# Patient Record
Sex: Female | Born: 1995 | Race: White | Hispanic: No | Marital: Single | Smoking: Former smoker
Health system: Southern US, Community
[De-identification: ages and names within clinical notes are randomized; demographics above are authoritative.]

## PROBLEM LIST (undated history)

## (undated) DIAGNOSIS — K859 Acute pancreatitis without necrosis or infection, unspecified: Secondary | ICD-10-CM

## (undated) DIAGNOSIS — D649 Anemia, unspecified: Secondary | ICD-10-CM

## (undated) HISTORY — PX: WISDOM TOOTH EXTRACTION: SHX21

---
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
## 2014-02-20 ENCOUNTER — Ambulatory Visit (INDEPENDENT_AMBULATORY_CARE_PROVIDER_SITE_OTHER): Payer: 59 | Admitting: Family Medicine

## 2014-02-20 ENCOUNTER — Ambulatory Visit (INDEPENDENT_AMBULATORY_CARE_PROVIDER_SITE_OTHER): Payer: 59

## 2014-02-20 VITALS — BP 124/82 | HR 90 | Temp 98.1°F | Resp 16 | Ht 63.0 in | Wt 154.0 lb

## 2014-02-20 DIAGNOSIS — R059 Cough, unspecified: Secondary | ICD-10-CM

## 2014-02-20 DIAGNOSIS — R05 Cough: Secondary | ICD-10-CM

## 2014-02-20 MED ORDER — HYDROCODONE-HOMATROPINE 5-1.5 MG/5ML PO SYRP: 5.0000 mL | ORAL_SOLUTION | Freq: Three times a day (TID) | ORAL | Status: DC | PRN

## 2014-02-20 NOTE — Patient Instructions (Signed)
Use the cough syrup as needed for cough You likely have a viral infection that is nearing its end.  However please let me know if you are not feeling better in the next few days- Sooner if worse.

## 2014-02-20 NOTE — Progress Notes (Signed)
[facility] [address], [place] [id number] [phone]  Date:  02/20/2014   Name:  Kristi CornerJenna M Harmon   DOB:  [birth year]   MRN:  [id number]  PCP:  No primary care provider on file.    Chief Complaint: Cough; Fever; Emesis; and Chest Pain   History of Present Illness:  Kristi Harmon is a 18 y.o. very pleasant female patient who presents with the following:  She is here today with illness for about one week. She notes a productive cough.   She also has noted some emesis.  No diarrhea.  Last vomited 3 days ago She has had fevers all week- up to 102.   Sh has noted runny nose, sneezing, body aches and chills.   She has used ibuprofen OTC.   Her last dose was yesterday- she does not currently have a fever.   She is generally in good health but does tend to get frequent colds.  Never a smoker  There are no active problems to display for this patient.   History reviewed. No pertinent past medical history.  History reviewed. No pertinent past surgical history.  History  Substance Use Topics  . Smoking status: Never Smoker   . Smokeless tobacco: Not on file  . Alcohol Use: No    Family History  Problem Relation Age of Onset  . Alcohol abuse Mother   . Cancer Paternal Grandmother     No Known Allergies  Medication list has been reviewed and updated.  No current outpatient prescriptions on file prior to visit.   No current facility-administered medications on file prior to visit.    Review of Systems:  As per HPI- otherwise negative.   Physical Examination: Filed Vitals:   02/20/14 1308  BP: 124/82  Pulse: 106  Temp: 98.1 F (36.7 C)  Resp: 16   Filed Vitals:   02/20/14 1308  Height: 5\' 3"  (1.6 m)  Weight: 154 lb (69.854 kg)   Body mass index is 27.29 kg/(m^2). Ideal Body Weight: Weight in (lb) to have BMI = 25: 140.8  GEN: WDWN, NAD, Non-toxic, A & O x 3, mild overweight, looks well HEENT: Atraumatic, Normocephalic. Neck supple.  No masses, No LAD.  Bilateral TM wnl, oropharynx normal.  PEERL,EOMI.   Ears and Nose: No external deformity. CV: RRR, No M/G/R. No JVD. No thrill. No extra heart sounds. PULM: CTA B, no wheezes, crackles, rhonchi. No retractions. No resp. distress. No accessory muscle use. ABD: S, NT, ND. No rebound. No HSM. Tenderness over her MSK chest due to prolonged cough EXTR: No c/c/e NEURO Normal gait.  PSYCH: Normally interactive. Conversant. Not depressed or anxious appearing.  Calm demeanor.   [facility] reading (PRIMARY) by  Dr. Patsy Lageropland. CXR: negative  CHEST 2 VIEW  COMPARISON: Chest x-ray 06/02/2012.  FINDINGS: Mediastinum and hilar structures normal. Lungs are clear. Heart size normal. No pleural effusion or pneumothorax. No acute osseous abnormality.  IMPRESSION: No active cardiopulmonary disease Assessment and Plan: Cough - Plan: DG Chest 2 View, HYDROcodone-homatropine (HYCODAN) 5-1.5 MG/5ML syrup  Treat for likely viral infection with hycodan as needed, continue ibuprofen as needed  See patient instructions for more details.     Signed Abbe AmsterdamJessica Tawonda Legaspi, MD

## 2016-02-24 ENCOUNTER — Emergency Department (HOSPITAL_COMMUNITY)
Admission: EM | Admit: 2016-02-24 | Discharge: 2016-02-25 | Disposition: A | Discharge status: Home / Self Care | Payer: Self-pay | Attending: Emergency Medicine | Admitting: Emergency Medicine

## 2016-02-24 ENCOUNTER — Encounter (HOSPITAL_COMMUNITY): Payer: Self-pay

## 2016-02-24 ENCOUNTER — Emergency Department (HOSPITAL_COMMUNITY): Payer: Self-pay

## 2016-02-24 DIAGNOSIS — F17210 Nicotine dependence, cigarettes, uncomplicated: Secondary | ICD-10-CM | POA: Insufficient documentation

## 2016-02-24 DIAGNOSIS — K859 Acute pancreatitis without necrosis or infection, unspecified: Secondary | ICD-10-CM

## 2016-02-24 HISTORY — DX: Anemia, unspecified: D64.9

## 2016-02-24 LAB — I-STAT BETA HCG BLOOD, ED (MC, WL, AP ONLY)

## 2016-02-24 LAB — CBC
HEMATOCRIT: 44.5 % (ref 36.0–46.0)
Hemoglobin: 14.6 g/dL (ref 12.0–15.0)
MCH: 29.7 pg (ref 26.0–34.0)
MCHC: 32.8 g/dL (ref 30.0–36.0)
MCV: 90.6 fL (ref 78.0–100.0)
PLATELETS: 339 10*3/uL (ref 150–400)
RBC: 4.91 MIL/uL (ref 3.87–5.11)
RDW: 14.8 % (ref 11.5–15.5)
WBC: 11.6 10*3/uL — AB (ref 4.0–10.5)

## 2016-02-24 LAB — COMPREHENSIVE METABOLIC PANEL
ALT: 13 U/L — AB (ref 14–54)
AST: 20 U/L (ref 15–41)
Albumin: 4.3 g/dL (ref 3.5–5.0)
Alkaline Phosphatase: 66 U/L (ref 38–126)
Anion gap: 9 (ref 5–15)
BUN: 9 mg/dL (ref 6–20)
CHLORIDE: 102 mmol/L (ref 101–111)
CO2: 27 mmol/L (ref 22–32)
CREATININE: 0.81 mg/dL (ref 0.44–1.00)
Calcium: 9.8 mg/dL (ref 8.9–10.3)
GFR calc Af Amer: >60 mL/min (ref >60)
Glucose, Bld: 103 mg/dL — ABNORMAL HIGH (ref 65–99)
POTASSIUM: 4.1 mmol/L (ref 3.5–5.1)
SODIUM: 138 mmol/L (ref 135–145)
Total Bilirubin: 0.5 mg/dL (ref 0.3–1.2)
Total Protein: 7.6 g/dL (ref 6.5–8.1)

## 2016-02-24 LAB — URINALYSIS, ROUTINE W REFLEX MICROSCOPIC
BILIRUBIN URINE: NEGATIVE
Glucose, UA: NEGATIVE mg/dL
Hgb urine dipstick: NEGATIVE
KETONES UR: NEGATIVE mg/dL
Nitrite: NEGATIVE
PROTEIN: 30 mg/dL — AB
Specific Gravity, Urine: 1.015 (ref 1.005–1.030)
pH: 6 (ref 5.0–8.0)

## 2016-02-24 LAB — LIPASE, BLOOD: LIPASE: 98 U/L — AB (ref 11–51)

## 2016-02-24 MED ORDER — OXYCODONE-ACETAMINOPHEN 5-325 MG PO TABS: 2.0000 | ORAL_TABLET | ORAL | 0 refills | Status: DC | PRN

## 2016-02-24 MED ORDER — METOCLOPRAMIDE HCL 5 MG/ML IJ SOLN
10.0000 mg | Freq: Once | INTRAMUSCULAR | Status: AC
  Administered 2016-02-24: 10 mg via INTRAVENOUS
  Filled 2016-02-24: qty 2

## 2016-02-24 MED ORDER — ONDANSETRON 8 MG PO TBDP: 8.0000 mg | ORAL_TABLET | Freq: Three times a day (TID) | ORAL | 0 refills | Status: DC | PRN

## 2016-02-24 MED ORDER — SODIUM CHLORIDE 0.9 % IV BOLUS (SEPSIS)
1000.0000 mL | Freq: Once | INTRAVENOUS | Status: AC
  Administered 2016-02-24: 1000 mL via INTRAVENOUS

## 2016-02-24 MED ORDER — MORPHINE SULFATE (PF) 4 MG/ML IV SOLN
2.0000 mg | Freq: Once | INTRAVENOUS | Status: AC
  Administered 2016-02-24: 2 mg via INTRAVENOUS
  Filled 2016-02-24: qty 1

## 2016-02-24 NOTE — ED Triage Notes (Signed)
Onset 3 days epigastric pain that is moving to umbilicus and lower abd.  C/o nausea.  No vomiting, diarrhea, fever.  No one in household sick.  Pt took pepto, midol with no relief.

## 2016-02-24 NOTE — Discharge Instructions (Addendum)
To find a primary care or specialty doctor please call [phone] or [phone] to access "[facility] Find a Doctor Service." ° °You may also go on the [facility] website at [email or web address] ° °There are also multiple [facility], [facility] and [facility] practices throughout the [place] that are frequently accepting new patients. You may find a clinic that is close to your home and contact them. ° °[facility] -  °[address] °[place] [address] [phone] ° ° °[facility] -  °[address] °[place] [address] [phone] ° ° °[facility] - °[address]  °[place] [address] °[phone] ° ° °

## 2016-02-24 NOTE — ED Provider Notes (Signed)
[facility]-EMERGENCY DEPT Provider Note   CSN: [id number] Arrival date & time: 02/24/16  2045     History   Chief Complaint Chief Complaint  Patient presents with  . Abdominal Pain    HPI Kristi Harmon is a 20 y.o. female.  HPI  Previously healthy 20 year old female G0 P0 6 active using condoms for birth control LMP last week normal. Normal time persisting complaining of upper abdominal pain that began 3 days ago. That she thought she was having some cramps initially. It has been intermittent and cramping. It seems to worsen somewhat with food. She's had nausea with one episode of vomiting this morning. She has not had fever or diarrhea. She blood or bile in vomit. She denies any abnormal vaginal discharge. She had some similar symptoms one time in the distant past and was seen a primary care office chest x-Joseline Mccampbell done. She denies any cough. She does smoke and drink alcohol after the symptoms began. His taken Pepto-Bismol, Midol, and ibuprofen without relief.  Past Medical History:  Diagnosis Date  . Anemia     There are no active problems to display for this patient.   Past Surgical History:  Procedure Laterality Date  . WISDOM TOOTH EXTRACTION      OB History    No data available       Home Medications    Prior to Admission medications   Medication Sig Start Date End Date Taking? Authorizing Provider  HYDROcodone-homatropine (HYCODAN) 5-1.5 MG/5ML syrup Take 5 mLs by mouth every 8 (eight) hours as needed for cough. 02/20/14   Pearline CablesJessica C Copland, MD    Family History Family History  Problem Relation Age of Onset  . Alcohol abuse Mother   . Cancer Paternal Grandmother     Social History Social History  Substance Use Topics  . Smoking status: Current Every Day Smoker    Types: Cigarettes  . Smokeless tobacco: Never Used  . Alcohol use Yes     Allergies   Patient has no known allergies.   Review of Systems Review of Systems  All other systems reviewed and are  negative.    Physical Exam Updated Vital Signs LMP 02/15/2016   Physical Exam  Constitutional: She is oriented to person, place, and time. She appears well-developed and well-nourished. No distress.  HENT:  Head: Normocephalic and atraumatic.  Right Ear: External ear normal.  Left Ear: External ear normal.  Nose: Nose normal.  Eyes: Conjunctivae and EOM are normal. Pupils are equal, round, and reactive to light.  Neck: Normal range of motion. Neck supple.  Cardiovascular: Normal rate, regular rhythm and normal heart sounds.   Pulmonary/Chest: Effort normal and breath sounds normal.  Abdominal: Soft. Bowel sounds are normal. She exhibits no distension and no mass. There is tenderness. There is no guarding.  Tenderness palpation epigastrium and left upper quadrant  Genitourinary: Vagina normal and uterus normal. No vaginal discharge found.  Musculoskeletal: Normal range of motion.  Neurological: She is alert and oriented to person, place, and time. She exhibits normal muscle tone. Coordination normal.  Skin: Skin is warm and dry.  Psychiatric: She has a normal mood and affect. Her behavior is normal. Thought content normal.  Nursing note and vitals reviewed.    ED Treatments / Results  Labs (all labs ordered are listed, but only abnormal results are displayed) Labs Reviewed  LIPASE, BLOOD - Abnormal; Notable for the following:       Result Value   Lipase 98 (*)  All other components within normal limits  COMPREHENSIVE METABOLIC PANEL - Abnormal; Notable for the following:    Glucose, Bld 103 (*)    ALT 13 (*)    All other components within normal limits  CBC - Abnormal; Notable for the following:    WBC 11.6 (*)    All other components within normal limits  URINALYSIS, ROUTINE W REFLEX MICROSCOPIC - Abnormal; Notable for the following:    APPearance HAZY (*)    Protein, ur 30 (*)    Leukocytes, UA TRACE (*)    Bacteria, UA RARE (*)    Squamous Epithelial / LPF 0-5  (*)    All other components within normal limits  I-STAT BETA HCG BLOOD, ED ([facility], [facility], [facility] ONLY)    EKG  EKG Interpretation None       Radiology No results found.  Procedures Procedures (including critical care time)  Medications Ordered in ED Medications - No data to display   Initial Impression / Assessment and Plan / ED Course  I have reviewed the triage vital signs and the nursing notes.  Pertinent labs & imaging results that were available during my care of the patient were reviewed by me and considered in my medical decision making (see chart for details).  Clinical Course   Renal-year-old female presented today with epigastric pain, nausea, and vomiting. Lipase is elevated here at 98 consistent with pancreatitis. Ultrasound pending. She is getting IV fluids. Medicines and antibiotics. Plan is to treat her pain, nausea, and she'll be referred outpatient to GI for follow-up.   [place] reviewed.  No gs. Discussed op and f/u care with patient and voices understanding.  Final Clinical Impressions(s) / ED Diagnoses   Final diagnoses:  Acute pancreatitis, unspecified complication status, unspecified pancreatitis type    New Prescriptions New Prescriptions   No medications on file     Margarita Grizzleanielle Laural Eiland, MD 02/25/16 0104

## 2016-02-25 MED ORDER — PROMETHAZINE HCL 25 MG PO TABS: 25.0000 mg | ORAL_TABLET | Freq: Four times a day (QID) | ORAL | 0 refills | Status: DC | PRN

## 2016-02-25 MED ORDER — MORPHINE SULFATE (PF) 4 MG/ML IV SOLN
4.0000 mg | Freq: Once | INTRAVENOUS | Status: AC
  Administered 2016-02-25: 4 mg via INTRAVENOUS
  Filled 2016-02-25: qty 1

## 2016-02-25 NOTE — ED Provider Notes (Signed)
1:10 AM  Assumed care from Dr. Rosalia Hammersay.  Pt is a 20 y.o. F who presents emergency department with epigastric pain for 3 days. Has had nausea and vomiting. Lipase mildly elevated. Ultrasound to rule out gallstones was pending. Ultrasound has been unremarkable. Patient reports feeling better after morphine but distal having some pain. We'll repeat a dose of pain medication and discharge home with outpatient follow-up. Prescription for Percocet, Phenergan provided. Discussed return precautions. She is comfortable with this plan.   Kristi MawKristen N Raylene Carmickle, DO 02/25/16 [phone]

## 2016-06-16 ENCOUNTER — Emergency Department (HOSPITAL_COMMUNITY)
Admission: EM | Admit: 2016-06-16 | Discharge: 2016-06-16 | Disposition: A | Discharge status: Home / Self Care | Payer: Self-pay | Attending: Emergency Medicine | Admitting: Emergency Medicine

## 2016-06-16 ENCOUNTER — Encounter (HOSPITAL_COMMUNITY): Payer: Self-pay | Admitting: Emergency Medicine

## 2016-06-16 DIAGNOSIS — F17210 Nicotine dependence, cigarettes, uncomplicated: Secondary | ICD-10-CM | POA: Insufficient documentation

## 2016-06-16 DIAGNOSIS — B86 Scabies: Secondary | ICD-10-CM | POA: Insufficient documentation

## 2016-06-16 MED ORDER — PERMETHRIN 5 % EX CREA: TOPICAL_CREAM | CUTANEOUS | 2 refills | Status: DC

## 2016-06-16 MED ORDER — HYDROXYZINE HCL 10 MG PO TABS: 10.0000 mg | ORAL_TABLET | Freq: Four times a day (QID) | ORAL | 0 refills | Status: DC | PRN

## 2016-06-16 NOTE — ED Triage Notes (Signed)
Pt complaint of worsening generalized hives of unknown cause; denies new soaps, detergents, foods, or medications.

## 2016-06-16 NOTE — ED Provider Notes (Signed)
[facility]-EMERGENCY DEPT Provider Note   CSN: [id number] Arrival date & time: 06/16/16  1726  By signing my name below, I, Sonum Patel, attest that this documentation has been prepared under the direction and in the presence of Will Bridie Colquhoun, PA-C. Electronically Signed: Sonum Patel, Neurosurgeon. 06/16/16. 6:09 PM.  History   Chief Complaint Chief Complaint  Patient presents with  . Rash    The history is provided by the patient. No language interpreter was used.    HPI Comments: ESLY Harmon is a 21 y.o. female who presents to the Emergency Department complaining of a persistent, gradually worsened, pruritic rash to the entire body that has been ongoing for the past couple of weeks. She states the rash is also present in the web spacing of her hands. She states the itching is worse after showering and at night. She states her roommate has the same symptoms. She denies fever, SOB, or any other symptoms at this time.   Past Medical History:  Diagnosis Date  . Anemia     There are no active problems to display for this patient.   Past Surgical History:  Procedure Laterality Date  . WISDOM TOOTH EXTRACTION      OB History    No data available       Home Medications    Prior to Admission medications   Medication Sig Start Date End Date Taking? Authorizing Provider  HYDROcodone-homatropine (HYCODAN) 5-1.5 MG/5ML syrup Take 5 mLs by mouth every 8 (eight) hours as needed for cough. 02/20/14   Pearline Cables, MD  hydrOXYzine (ATARAX/VISTARIL) 10 MG tablet Take 1 tablet (10 mg total) by mouth every 6 (six) hours as needed for itching. 06/16/16   Everlene Farrier, PA-C  ondansetron (ZOFRAN ODT) 8 MG disintegrating tablet Take 1 tablet (8 mg total) by mouth every 8 (eight) hours as needed for nausea or vomiting. 02/24/16   Margarita Grizzle, MD  oxyCODONE-acetaminophen (PERCOCET/ROXICET) 5-325 MG tablet Take 2 tablets by mouth every 4 (four) hours as needed for severe pain. 02/24/16   Margarita Grizzle,  MD  permethrin (ELIMITE) 5 % cream Apply to entire body other than face - let sit for 14 hours then wash off, may repeat in 1 week if still having symptoms 06/16/16   Everlene Farrier, PA-C  promethazine (PHENERGAN) 25 MG tablet Take 1 tablet (25 mg total) by mouth every 6 (six) hours as needed for nausea or vomiting. 02/25/16   Layla Maw Ward, DO    Family History Family History  Problem Relation Age of Onset  . Alcohol abuse Mother   . Cancer Paternal Grandmother     Social History Social History  Substance Use Topics  . Smoking status: Current Every Day Smoker    Types: Cigarettes  . Smokeless tobacco: Never Used  . Alcohol use Yes     Allergies   Patient has no known allergies.   Review of Systems Review of Systems  Constitutional: Negative for fever.  HENT: Negative for facial swelling.   Respiratory: Negative for shortness of breath.   Skin: Positive for rash.     Physical Exam Updated Vital Signs BP 109/75 (BP Location: Left Arm)   Pulse 88   Temp 98.1 F (36.7 C) (Oral)   Resp 18   Wt 48.8 kg   SpO2 99%   BMI 19.04 kg/m   Physical Exam  Constitutional: She appears well-developed and well-nourished. No distress.  Nontoxic-appearing.  HENT:  Head: Normocephalic and atraumatic.  Mouth/Throat: Oropharynx is  clear and moist.  Eyes: Right eye exhibits no discharge. Left eye exhibits no discharge.  Pulmonary/Chest: Effort normal. No respiratory distress.  Neurological: She is alert. Coordination normal.  Skin: Skin is warm and dry. Capillary refill takes less than 2 seconds. Rash noted. She is not diaphoretic. No erythema. No pallor.  Excoriations and burrows noted to her bilateral arms, legs, chest, and abdomen. Burrows and lesions noted between her web spaces. No erythema, vesicles, or discharge. No hives.   Psychiatric: She has a normal mood and affect. Her behavior is normal.  Nursing note and vitals reviewed.    ED Treatments / Results  DIAGNOSTIC  STUDIES: Oxygen Saturation is 99% on RA, normal by my interpretation.    COORDINATION OF CARE: 6:04 PM Discussed treatment plan with pt at bedside and pt agreed to plan.   Labs (all labs ordered are listed, but only abnormal results are displayed) Labs Reviewed - No data to display  EKG  EKG Interpretation None       Radiology No results found.  Procedures Procedures (including critical care time)  Medications Ordered in ED Medications - No data to display   Initial Impression / Assessment and Plan / ED Course  I have reviewed the triage vital signs and the nursing notes.  Pertinent labs & imaging results that were available during my care of the patient were reviewed by me and considered in my medical decision making (see chart for details).    Patient with itching for several weeks throughout her body including her web spaces. Roommate at home also has the same symptoms. Patient has classic symptoms of scabies. Evidence of burrows between her web spaces. The rash spares her face and head. Discussed diagnosis & treatment of scabies with patient.  They have been advised to followup with her primary care doctor 2 weeks after treatment.  They have also been advised to clean entire household including washing sheets in the hottest water possible and using R.I.D. spray in the car and on sofa. Discussed the proper application of permethrin cream.  Patient  advised to repeat treatment in one week. No signs of secondary infection.  Discussed return precautions. Patient advised to follow up with PCP for unresolved symptoms. Patient appears safe for discharge. I advised the patient to follow-up with their primary care provider this week. I advised the patient to return to the emergency department with new or worsening symptoms or new concerns. The patient verbalized understanding and agreement with plan.     Final Clinical Impressions(s) / ED Diagnoses   Final diagnoses:  Scabies     New Prescriptions New Prescriptions   HYDROXYZINE (ATARAX/VISTARIL) 10 MG TABLET    Take 1 tablet (10 mg total) by mouth every 6 (six) hours as needed for itching.   PERMETHRIN (ELIMITE) 5 % CREAM    Apply to entire body other than face - let sit for 14 hours then wash off, may repeat in 1 week if still having symptoms   I personally performed the services described in this documentation, which was scribed in my presence. The recorded information has been reviewed and is accurate.       Everlene Farrier, PA-C 06/16/16 1815    Shaune Pollack, MD 06/18/16 [phone]

## 2016-08-03 ENCOUNTER — Encounter (HOSPITAL_COMMUNITY): Payer: Self-pay | Admitting: Emergency Medicine

## 2016-08-03 ENCOUNTER — Emergency Department (HOSPITAL_COMMUNITY)
Admission: EM | Admit: 2016-08-03 | Discharge: 2016-08-03 | Disposition: A | Discharge status: Home / Self Care | Payer: Self-pay | Attending: Emergency Medicine | Admitting: Emergency Medicine

## 2016-08-03 DIAGNOSIS — S80262A Insect bite (nonvenomous), left knee, initial encounter: Secondary | ICD-10-CM | POA: Insufficient documentation

## 2016-08-03 DIAGNOSIS — Y939 Activity, unspecified: Secondary | ICD-10-CM | POA: Insufficient documentation

## 2016-08-03 DIAGNOSIS — W57XXXA Bitten or stung by nonvenomous insect and other nonvenomous arthropods, initial encounter: Secondary | ICD-10-CM | POA: Insufficient documentation

## 2016-08-03 DIAGNOSIS — Y999 Unspecified external cause status: Secondary | ICD-10-CM | POA: Insufficient documentation

## 2016-08-03 DIAGNOSIS — F17210 Nicotine dependence, cigarettes, uncomplicated: Secondary | ICD-10-CM | POA: Insufficient documentation

## 2016-08-03 DIAGNOSIS — Y929 Unspecified place or not applicable: Secondary | ICD-10-CM | POA: Insufficient documentation

## 2016-08-03 LAB — CBC WITH DIFFERENTIAL/PLATELET
BASOS ABS: 0 10*3/uL (ref 0.0–0.1)
BASOS PCT: 0 %
EOS ABS: 0.1 10*3/uL (ref 0.0–0.7)
EOS PCT: 1 %
HCT: 41.4 % (ref 36.0–46.0)
Hemoglobin: 13.7 g/dL (ref 12.0–15.0)
Lymphocytes Relative: 38 %
Lymphs Abs: 2.9 10*3/uL (ref 0.7–4.0)
MCH: 29.5 pg (ref 26.0–34.0)
MCHC: 33.1 g/dL (ref 30.0–36.0)
MCV: 89 fL (ref 78.0–100.0)
MONO ABS: 0.5 10*3/uL (ref 0.1–1.0)
Monocytes Relative: 7 %
Neutro Abs: 4.1 10*3/uL (ref 1.7–7.7)
Neutrophils Relative %: 54 %
PLATELETS: 291 10*3/uL (ref 150–400)
RBC: 4.65 MIL/uL (ref 3.87–5.11)
RDW: 14.4 % (ref 11.5–15.5)
WBC: 7.7 10*3/uL (ref 4.0–10.5)

## 2016-08-03 LAB — URINALYSIS, ROUTINE W REFLEX MICROSCOPIC
BILIRUBIN URINE: NEGATIVE
GLUCOSE, UA: NEGATIVE mg/dL
Hgb urine dipstick: NEGATIVE
Ketones, ur: NEGATIVE mg/dL
NITRITE: NEGATIVE
PH: 6 (ref 5.0–8.0)
Protein, ur: NEGATIVE mg/dL
SPECIFIC GRAVITY, URINE: 1.019 (ref 1.005–1.030)

## 2016-08-03 LAB — COMPREHENSIVE METABOLIC PANEL
ALBUMIN: 3.9 g/dL (ref 3.5–5.0)
ALK PHOS: 42 U/L (ref 38–126)
ALT: 13 U/L — ABNORMAL LOW (ref 14–54)
AST: 15 U/L (ref 15–41)
Anion gap: 5 (ref 5–15)
BILIRUBIN TOTAL: 0.4 mg/dL (ref 0.3–1.2)
BUN: 13 mg/dL (ref 6–20)
CALCIUM: 9 mg/dL (ref 8.9–10.3)
CO2: 24 mmol/L (ref 22–32)
Chloride: 108 mmol/L (ref 101–111)
Creatinine, Ser: 0.75 mg/dL (ref 0.44–1.00)
GFR calc Af Amer: >60 mL/min (ref >60)
GLUCOSE: 95 mg/dL (ref 65–99)
POTASSIUM: 4.1 mmol/L (ref 3.5–5.1)
Sodium: 137 mmol/L (ref 135–145)
TOTAL PROTEIN: 7.5 g/dL (ref 6.5–8.1)

## 2016-08-03 LAB — I-STAT BETA HCG BLOOD, ED (MC, WL, AP ONLY): I-stat hCG, quantitative: <5 m[IU]/mL (ref <5)

## 2016-08-03 MED ORDER — DOXYCYCLINE HYCLATE 100 MG PO CAPS: 100.0000 mg | ORAL_CAPSULE | Freq: Two times a day (BID) | ORAL | 0 refills | Status: DC

## 2016-08-03 NOTE — Discharge Instructions (Signed)
Use hydrocortisone cream for itching Alternate heat and ice Take Doxycycline for the next 5 days Return if worsening

## 2016-08-03 NOTE — ED Triage Notes (Signed)
Patient here with complaints of insect bite to left leg near knee x1 week. Pain 4/10 increased with walking. Denies fever.

## 2016-08-03 NOTE — ED Provider Notes (Signed)
[facility]-EMERGENCY DEPT Provider Note    By signing my name below, I, Earmon Phoenix, attest that this documentation has been prepared under the direction and in the presence of Terance Hart, PA-C. Electronically Signed: Earmon Phoenix, ED Scribe. 08/03/16. 12:22 PM.    History   Chief Complaint Chief Complaint  Patient presents with  . Insect Bite    The history is provided by the patient and medical records. No language interpreter was used.    Kristi Harmon is a 21 y.o. female who presents to the Emergency Department complaining of an itching, progressively worsening insect bite to the upper lateral left knee that occurred four days ago. She reports associated nausea, HA, diaphoresis, myalgias, left knee pain. She did not feel a bite or see what bit her and assumes it was an insect. She denies anyone in her household has anything similar. She states she had cold-like symptoms last week that have resolved and she believes are unrelated. She has not taking anything for her symptoms. There are no modifying factors noted. She denies difficulty walking, difficulty flexing and extending the knee, vomiting, fever, chills, rash. Her main concern is that it has been getting bigger.   Past Medical History:  Diagnosis Date  . Anemia     There are no active problems to display for this patient.   Past Surgical History:  Procedure Laterality Date  . WISDOM TOOTH EXTRACTION      OB History    No data available       Home Medications    Prior to Admission medications   Medication Sig Start Date End Date Taking? Authorizing Provider  HYDROcodone-homatropine (HYCODAN) 5-1.5 MG/5ML syrup Take 5 mLs by mouth every 8 (eight) hours as needed for cough. 02/20/14   Copland, Gwenlyn Found, MD  hydrOXYzine (ATARAX/VISTARIL) 10 MG tablet Take 1 tablet (10 mg total) by mouth every 6 (six) hours as needed for itching. 06/16/16   Everlene Farrier, PA-C  ondansetron (ZOFRAN ODT) 8 MG disintegrating tablet  Take 1 tablet (8 mg total) by mouth every 8 (eight) hours as needed for nausea or vomiting. 02/24/16   Margarita Grizzle, MD  oxyCODONE-acetaminophen (PERCOCET/ROXICET) 5-325 MG tablet Take 2 tablets by mouth every 4 (four) hours as needed for severe pain. 02/24/16   Margarita Grizzle, MD  permethrin (ELIMITE) 5 % cream Apply to entire body other than face - let sit for 14 hours then wash off, may repeat in 1 week if still having symptoms 06/16/16   Everlene Farrier, PA-C  promethazine (PHENERGAN) 25 MG tablet Take 1 tablet (25 mg total) by mouth every 6 (six) hours as needed for nausea or vomiting. 02/25/16   Ward, Layla Maw, DO    Family History Family History  Problem Relation Age of Onset  . Alcohol abuse Mother   . Cancer Paternal Grandmother     Social History Social History  Substance Use Topics  . Smoking status: Current Every Day Smoker    Types: Cigarettes  . Smokeless tobacco: Never Used  . Alcohol use Yes     Allergies   Patient has no known allergies.   Review of Systems Review of Systems  Constitutional: Positive for diaphoresis. Negative for chills and fever.  Gastrointestinal: Positive for nausea. Negative for vomiting.  Musculoskeletal: Positive for arthralgias. Negative for joint swelling.  Skin: Positive for color change and wound. Negative for rash.  Neurological: Positive for headaches.     Physical Exam Updated Vital Signs BP 117/77 (BP Location: Right  Arm)   Pulse 79   Temp 97.8 F (36.6 C) (Oral)   Resp 18   SpO2 99%   Physical Exam  Constitutional: She is oriented to person, place, and time. She appears well-developed and well-nourished. No distress.  HENT:  Head: Normocephalic and atraumatic.  Eyes: Conjunctivae are normal. Pupils are equal, round, and reactive to light. Right eye exhibits no discharge. Left eye exhibits no discharge. No scleral icterus.  Neck: Normal range of motion.  Cardiovascular: Normal rate.   Pulmonary/Chest: Effort normal.  No respiratory distress.  Abdominal: She exhibits no distension.  Musculoskeletal: Normal range of motion.  FROM of knee. Ambulatory  Neurological: She is alert and oriented to person, place, and time.  Skin: Skin is warm and dry.  2cm circular erythema which feels indurated under the skin. No central area of necrosis. No other skin lesions noted. See picture for details.  Psychiatric: She has a normal mood and affect. Her behavior is normal.  Nursing note and vitals reviewed.      ED Treatments / Results  DIAGNOSTIC STUDIES: Oxygen Saturation is 99% on RA, normal by my interpretation.   COORDINATION OF CARE: 10:29 AM- Will speak with Dr. Clarene Duke about appropriate plan of care. Pt verbalizes understanding and agrees to plan.  11:24 AM- Will order labs.   Medications - No data to display  Labs (all labs ordered are listed, but only abnormal results are displayed) Labs Reviewed  COMPREHENSIVE METABOLIC PANEL - Abnormal; Notable for the following:       Result Value   ALT 13 (*)    All other components within normal limits  URINALYSIS, ROUTINE W REFLEX MICROSCOPIC - Abnormal; Notable for the following:    APPearance HAZY (*)    Leukocytes, UA TRACE (*)    Bacteria, UA RARE (*)    Squamous Epithelial / LPF 6-30 (*)    All other components within normal limits  CBC WITH DIFFERENTIAL/PLATELET  I-STAT BETA HCG BLOOD, ED ([facility], [facility], [facility] ONLY)    EKG  EKG Interpretation None       Radiology No results found.  Procedures Procedures (including critical care time)  Medications Ordered in ED Medications - No data to display   Initial Impression / Assessment and Plan / ED Course  I have reviewed the triage vital signs and the nursing notes.  Pertinent labs & imaging results that were available during my care of the patient were reviewed by me and considered in my medical decision making (see chart for details).  22 year old female with insect bite of unknown origin.  Vitals are normal. Bite is minimal however pt is reporting a multitude of symptoms. Shared visit with Dr. Clarene Duke. Will obtain labs and UA. If these are normal will treat with Doxy empirically.  Labs are unremarkable. UA normal. Preg test negative. Will treat with Doxy and advised supportive care with ice/heat, hydrocortisone cream for itching. Return precautions given.   Discharge Medication List as of 08/03/2016 12:34 PM    START taking these medications   Details  doxycycline (VIBRAMYCIN) 100 MG capsule Take 1 capsule (100 mg total) by mouth 2 (two) times daily., Starting Sun 08/03/2016, Print        Final Clinical Impressions(s) / ED Diagnoses   Final diagnoses:  Insect bite, initial encounter    New Prescriptions Discharge Medication List as of 08/03/2016 12:34 PM    START taking these medications   Details  doxycycline (VIBRAMYCIN) 100 MG capsule Take 1 capsule (  100 mg total) by mouth 2 (two) times daily., Starting Sun 08/03/2016, Print        I personally performed the services described in this documentation, which was scribed in my presence. The recorded information has been reviewed and is accurate.     Bethel BornGekas, Niko Penson Marie, PA-C 08/03/16 1305    Clarene DukeLittle, Ambrose Finlandachel Morgan, MD 08/06/16 [phone]

## 2016-08-03 NOTE — ED Notes (Signed)
Bed: WTR7 Expected date:  Expected time:  Means of arrival:  Comments: 

## 2016-08-04 ENCOUNTER — Emergency Department (HOSPITAL_COMMUNITY): Payer: Self-pay

## 2016-08-04 ENCOUNTER — Emergency Department (HOSPITAL_COMMUNITY)
Admission: EM | Admit: 2016-08-04 | Discharge: 2016-08-04 | Disposition: A | Discharge status: Home / Self Care | Payer: Self-pay | Attending: Emergency Medicine | Admitting: Emergency Medicine

## 2016-08-04 ENCOUNTER — Encounter (HOSPITAL_COMMUNITY): Payer: Self-pay | Admitting: Emergency Medicine

## 2016-08-04 DIAGNOSIS — Y939 Activity, unspecified: Secondary | ICD-10-CM | POA: Insufficient documentation

## 2016-08-04 DIAGNOSIS — F17210 Nicotine dependence, cigarettes, uncomplicated: Secondary | ICD-10-CM | POA: Insufficient documentation

## 2016-08-04 DIAGNOSIS — W228XXA Striking against or struck by other objects, initial encounter: Secondary | ICD-10-CM | POA: Insufficient documentation

## 2016-08-04 DIAGNOSIS — M79672 Pain in left foot: Secondary | ICD-10-CM

## 2016-08-04 DIAGNOSIS — S9032XA Contusion of left foot, initial encounter: Secondary | ICD-10-CM | POA: Insufficient documentation

## 2016-08-04 DIAGNOSIS — Z79899 Other long term (current) drug therapy: Secondary | ICD-10-CM | POA: Insufficient documentation

## 2016-08-04 DIAGNOSIS — Y999 Unspecified external cause status: Secondary | ICD-10-CM | POA: Insufficient documentation

## 2016-08-04 DIAGNOSIS — Y929 Unspecified place or not applicable: Secondary | ICD-10-CM | POA: Insufficient documentation

## 2016-08-04 NOTE — ED Provider Notes (Signed)
[facility]-EMERGENCY DEPT Provider Note   CSN: [id number] Arrival date & time: 08/04/16  2103     History   Chief Complaint Chief Complaint  Patient presents with  . Foot Injury    HPI Kristi Harmon is a 21 y.o. female female who presents with left foot pain that began tonight at 7 PM after hitting it on a table. Patient states that she was walking when she hit the hit the corner of the table with her foot. She denies any fall or twist injury to the ankle. She reports difficulty in relating secondary to the pain. She reports that pain is worsened when she tries in delay or per weight. He is taken one dose of ibuprofen prior to arriving in the emergency department. He denies any ankle pain, numbness or weakness of the left lower extremity.  The history is provided by the patient.    Past Medical History:  Diagnosis Date  . Anemia     There are no active problems to display for this patient.   Past Surgical History:  Procedure Laterality Date  . WISDOM TOOTH EXTRACTION      OB History    No data available       Home Medications    Prior to Admission medications   Medication Sig Start Date End Date Taking? Authorizing Provider  doxycycline (VIBRAMYCIN) 100 MG capsule Take 1 capsule (100 mg total) by mouth 2 (two) times daily. 08/03/16   Bethel Born, PA-C  HYDROcodone-homatropine (HYCODAN) 5-1.5 MG/5ML syrup Take 5 mLs by mouth every 8 (eight) hours as needed for cough. 02/20/14   Copland, Gwenlyn Found, MD  hydrOXYzine (ATARAX/VISTARIL) 10 MG tablet Take 1 tablet (10 mg total) by mouth every 6 (six) hours as needed for itching. 06/16/16   Everlene Farrier, PA-C  ondansetron (ZOFRAN ODT) 8 MG disintegrating tablet Take 1 tablet (8 mg total) by mouth every 8 (eight) hours as needed for nausea or vomiting. 02/24/16   Margarita Grizzle, MD  oxyCODONE-acetaminophen (PERCOCET/ROXICET) 5-325 MG tablet Take 2 tablets by mouth every 4 (four) hours as needed for severe pain. 02/24/16   Margarita Grizzle, MD  permethrin (ELIMITE) 5 % cream Apply to entire body other than face - let sit for 14 hours then wash off, may repeat in 1 week if still having symptoms 06/16/16   Everlene Farrier, PA-C  promethazine (PHENERGAN) 25 MG tablet Take 1 tablet (25 mg total) by mouth every 6 (six) hours as needed for nausea or vomiting. 02/25/16   Ward, Layla Maw, DO    Family History Family History  Problem Relation Age of Onset  . Alcohol abuse Mother   . Cancer Paternal Grandmother     Social History Social History  Substance Use Topics  . Smoking status: Current Every Day Smoker    Types: Cigarettes  . Smokeless tobacco: Never Used  . Alcohol use Yes     Allergies   Patient has no known allergies.   Review of Systems Review of Systems  Musculoskeletal:       +Left foot pain  Neurological: Negative for weakness and numbness.     Physical Exam Updated Vital Signs BP 106/73   Pulse 84   Temp 99.2 F (37.3 C) (Oral)   Resp 20   Ht 5\' 1"  (1.549 m)   Wt 55.6 kg (122 lb 8 oz)   LMP 07/14/2016   SpO2 98%   BMI 23.15 kg/m   Physical Exam  Constitutional: She appears well-developed  and well-nourished.  HENT:  Head: Normocephalic and atraumatic.  Eyes: Conjunctivae and EOM are normal. Right eye exhibits no discharge. Left eye exhibits no discharge. No scleral icterus.  Cardiovascular:  Pulses:      Dorsalis pedis pulses are 2+ on the right side, and 2+ on the left side.  Pulmonary/Chest: Effort normal.  Musculoskeletal: She exhibits no deformity.  Tenderness palpation to the medial aspect of the left foot with small overlying area of ecchymosis. No erythema, warmth or deformity noted. Dorsiflexion and plantar flexion of left ankle intact without difficulty. No tenderness palpation of the metatarsal joints.  Neurological: She is alert.  Skin: Skin is warm and dry. Capillary refill takes less than 2 seconds.  Psychiatric: She has a normal mood and affect. Her speech is normal  and behavior is normal.  Nursing note and vitals reviewed.    ED Treatments / Results  Labs (all labs ordered are listed, but only abnormal results are displayed) Labs Reviewed - No data to display  EKG  EKG Interpretation None       Radiology Dg Foot Complete Left  Result Date: 08/04/2016 CLINICAL DATA:  Hit foot on table with pain and bruising to the medial side EXAM: LEFT FOOT - COMPLETE 3+ VIEW COMPARISON:  None. FINDINGS: There is no evidence of fracture or dislocation. There is no evidence of arthropathy or other focal bone abnormality. Soft tissues are unremarkable. IMPRESSION: Negative. Electronically Signed   By: Jasmine PangKim  Fujinaga M.D.   On: 08/04/2016 22:14    Procedures Procedures (including critical care time)  Medications Ordered in ED Medications - No data to display   Initial Impression / Assessment and Plan / ED Course  I have reviewed the triage vital signs and the nursing notes.  Pertinent labs & imaging results that were available during my care of the patient were reviewed by me and considered in my medical decision making (see chart for details).     21 year old female who presents with left foot pain that began tonight after hitting it on a table. Patient is afebrile, non-toxic appearing, sitting comfortably on examination table. Consider fracture versus dislocation. Low suspicion for sprain given mechanism of injury and lack of ankle inversion or eversion. Patient is neurovascularly intact. Provide patient analgesics in the department. X-ray ordered at triage.  X-ray reviewed. Negative for any acute fracture or dislocation. Explained to patient that she likely has a small contusion and will have some soreness for the next few days. Check it hurts take Tylenol or ibuprofen as needed for pain. Will provide a postop shoe in the department for comfort. Patient instructed to follow up with her primary care doctor in the next 24-48 hours for further evaluation.  Provided patient with a list of clinic resources to use if he does not have a PCP. Instructed to call them today to arrange follow-up in the next 24-48 hours.  Return precautions discussed. Patient expresses understanding and agreement to plan.     Final Clinical Impressions(s) / ED Diagnoses   Final diagnoses:  Contusion of left foot, initial encounter  Left foot pain    New Prescriptions Discharge Medication List as of 08/04/2016 11:19 PM       Maxwell CaulLayden, Chynna Buerkle A, PA-C 08/05/16 0231    Charlynne PanderYao, David Hsienta, MD 08/05/16 [phone]

## 2016-08-04 NOTE — Discharge Instructions (Signed)
Continue Tylenol or ibuprofen as needed for pain.  Use the postop shoe for comfort and support.  Follow-up with primary care doctor next 24-48 hours. If you do not have a primary care doctor he can use a list provided below to find one.  Return to the emergency department for any worsening pain, swelling/redness of the foot, difficulty walking, fever or any other worsening or concerning symptoms.  If you do not have a primary care doctor you see regularly, please you the list below. Please call them to arrange for follow-up.    No Primary Care Doctor Call [facility]  [phone] Other agencies that provide inexpensive medical care    [facility]  [phone]    [facility]  [phone]    [facility]  [phone]    [facility]  [phone]    [facility]  [phone]    [facility]  [phone]

## 2016-08-04 NOTE — ED Triage Notes (Signed)
Patient c/o left foot pain after hitting it on a table. Redness noted, no obvious deformity.

## 2017-05-12 ENCOUNTER — Emergency Department (HOSPITAL_COMMUNITY)
Admission: EM | Admit: 2017-05-12 | Discharge: 2017-05-12 | Disposition: A | Discharge status: Home / Self Care | Payer: Self-pay | Attending: Emergency Medicine | Admitting: Emergency Medicine

## 2017-05-12 ENCOUNTER — Encounter (HOSPITAL_COMMUNITY): Payer: Self-pay | Admitting: Emergency Medicine

## 2017-05-12 ENCOUNTER — Emergency Department (HOSPITAL_COMMUNITY): Payer: Self-pay

## 2017-05-12 DIAGNOSIS — R05 Cough: Secondary | ICD-10-CM | POA: Insufficient documentation

## 2017-05-12 DIAGNOSIS — R112 Nausea with vomiting, unspecified: Secondary | ICD-10-CM | POA: Insufficient documentation

## 2017-05-12 DIAGNOSIS — R0603 Acute respiratory distress: Secondary | ICD-10-CM | POA: Insufficient documentation

## 2017-05-12 DIAGNOSIS — F17210 Nicotine dependence, cigarettes, uncomplicated: Secondary | ICD-10-CM | POA: Insufficient documentation

## 2017-05-12 DIAGNOSIS — J101 Influenza due to other identified influenza virus with other respiratory manifestations: Secondary | ICD-10-CM | POA: Insufficient documentation

## 2017-05-12 DIAGNOSIS — R0600 Dyspnea, unspecified: Secondary | ICD-10-CM

## 2017-05-12 HISTORY — DX: Acute pancreatitis without necrosis or infection, unspecified: K85.90

## 2017-05-12 LAB — CBC
HCT: 42.1 % (ref 36.0–46.0)
Hemoglobin: 14.2 g/dL (ref 12.0–15.0)
MCH: 31 pg (ref 26.0–34.0)
MCHC: 33.7 g/dL (ref 30.0–36.0)
MCV: 91.9 fL (ref 78.0–100.0)
PLATELETS: 192 10*3/uL (ref 150–400)
RBC: 4.58 MIL/uL (ref 3.87–5.11)
RDW: 13.2 % (ref 11.5–15.5)
WBC: 8.2 10*3/uL (ref 4.0–10.5)

## 2017-05-12 LAB — I-STAT BETA HCG BLOOD, ED (MC, WL, AP ONLY)

## 2017-05-12 LAB — INFLUENZA PANEL BY PCR (TYPE A & B)
INFLAPCR: POSITIVE — AB
INFLBPCR: NEGATIVE

## 2017-05-12 LAB — LIPASE, BLOOD: LIPASE: 35 U/L (ref 11–51)

## 2017-05-12 LAB — COMPREHENSIVE METABOLIC PANEL
ALK PHOS: 52 U/L (ref 38–126)
ALT: 16 U/L (ref 14–54)
AST: 23 U/L (ref 15–41)
Albumin: 4.3 g/dL (ref 3.5–5.0)
Anion gap: 14 (ref 5–15)
BUN: 8 mg/dL (ref 6–20)
CALCIUM: 9.4 mg/dL (ref 8.9–10.3)
CHLORIDE: 104 mmol/L (ref 101–111)
CO2: 19 mmol/L — AB (ref 22–32)
CREATININE: 0.67 mg/dL (ref 0.44–1.00)
GFR calc Af Amer: >60 mL/min (ref >60)
Glucose, Bld: 103 mg/dL — ABNORMAL HIGH (ref 65–99)
Potassium: 4.4 mmol/L (ref 3.5–5.1)
Sodium: 137 mmol/L (ref 135–145)
Total Bilirubin: 0.5 mg/dL (ref 0.3–1.2)
Total Protein: 8 g/dL (ref 6.5–8.1)

## 2017-05-12 LAB — URINALYSIS, ROUTINE W REFLEX MICROSCOPIC
Bilirubin Urine: NEGATIVE
GLUCOSE, UA: NEGATIVE mg/dL
HGB URINE DIPSTICK: NEGATIVE
Ketones, ur: 80 mg/dL — AB
Leukocytes, UA: NEGATIVE
Nitrite: NEGATIVE
Protein, ur: NEGATIVE mg/dL
Specific Gravity, Urine: 1.01 (ref 1.005–1.030)
pH: 6 (ref 5.0–8.0)

## 2017-05-12 LAB — BRAIN NATRIURETIC PEPTIDE: B NATRIURETIC PEPTIDE 5: 15.2 pg/mL (ref 0.0–100.0)

## 2017-05-12 LAB — TROPONIN I

## 2017-05-12 MED ORDER — MIDAZOLAM HCL 2 MG/2ML IJ SOLN: 2.0000 mg | Freq: Once | INTRAMUSCULAR | Status: DC

## 2017-05-12 MED ORDER — LORAZEPAM 1 MG PO TABS: 1.0000 mg | ORAL_TABLET | Freq: Once | ORAL | Status: DC

## 2017-05-12 MED ORDER — LACTATED RINGERS IV BOLUS (SEPSIS): 1000.0000 mL | Freq: Once | INTRAVENOUS | Status: DC

## 2017-05-12 NOTE — Discharge Instructions (Signed)
You have confirmed influenza.  Please hydrate well. Return to the emergency room immediately if you start having worsening of your shortness of breath, or severe weakness, confusion.

## 2017-05-12 NOTE — ED Provider Notes (Addendum)
[facility]-EMERGENCY DEPT Provider Note   CSN: [id number] Arrival date & time: 05/12/17  1213     History   Chief Complaint Chief Complaint  Patient presents with  . Cough  . Generalized Body Aches  . Emesis  . Diarrhea    HPI Kristi Harmon is a 22 y.o. female.  HPI 22 year old female comes in with chief complaint of flulike illness. Patient states that she started having her symptoms 4 days ago.  Patient is primarily having cough, sore throat, generalized body aches, fevers, chills.  Patient also has had emesis.  Patient's cough has been productive and she appreciates chest pain with inspiration and with cough.  Patient also states that her shortness of breath is getting worse over the past 24 hours, and she has worsening of her shortness of breath with exertion and when she is laying flat.  Patient has never been diagnosed with anxiety, but she does feel anxious when she gets short of breath.  Patient denies any active stimulant abuse or cocaine use. Pt has no hx of PE, DVT and denies any exogenous hormone (testosterone / estrogen) use, long distance travels or surgery in the past 6 weeks, active cancer, recent immobilization.    Past Medical History:  Diagnosis Date  . Anemia   . Pancreatitis     There are no active problems to display for this patient.   Past Surgical History:  Procedure Laterality Date  . WISDOM TOOTH EXTRACTION      OB History    No data available       Home Medications    Prior to Admission medications   Medication Sig Start Date End Date Taking? Authorizing Provider  diphenhydrAMINE (SOMINEX) 25 MG tablet Take 25-50 mg by mouth at bedtime as needed for sleep.   Yes [provider]  etonogestrel-ethinyl estradiol (NUVARING) 0.12-0.015 MG/24HR vaginal ring Place 1 each vaginally every 28 (twenty-eight) days. Insert vaginally and leave in place for 3 consecutive weeks, then remove for 1 week.   Yes [provider]  ibuprofen (ADVIL,MOTRIN) 200 MG tablet Take 400 mg by mouth every 5 (five) hours as needed for fever or moderate pain.   Yes [provider]  Pseudoephed-APAP-Guaifenesin (TYLENOL SINUS SEVERE CONGEST PO) Take 2 tablets by mouth daily as needed (CHEST CONGESTION).   Yes [provider]  doxycycline (VIBRAMYCIN) 100 MG capsule Take 1 capsule (100 mg total) by mouth 2 (two) times daily. Patient not taking: Reported on 05/12/2017 08/03/16   Bethel Born, PA-C  HYDROcodone-homatropine [facility]) 5-1.5 MG/5ML syrup Take 5 mLs by mouth every 8 (eight) hours as needed for cough. Patient not taking: Reported on 05/12/2017 02/20/14   Copland, Gwenlyn Found, MD  hydrOXYzine (ATARAX/VISTARIL) 10 MG tablet Take 1 tablet (10 mg total) by mouth every 6 (six) hours as needed for itching. Patient not taking: Reported on 05/12/2017 06/16/16   Everlene Farrier, PA-C  ondansetron (ZOFRAN ODT) 8 MG disintegrating tablet Take 1 tablet (8 mg total) by mouth every 8 (eight) hours as needed for nausea or vomiting. Patient not taking: Reported on 05/12/2017 02/24/16   Margarita Grizzle, MD  oxyCODONE-acetaminophen (PERCOCET/ROXICET) 5-325 MG tablet Take 2 tablets by mouth every 4 (four) hours as needed for severe pain. Patient not taking: Reported on 05/12/2017 02/24/16   Margarita Grizzle, MD  permethrin (ELIMITE) 5 % cream Apply to entire body other than face - let sit for 14 hours then wash off, may repeat in 1 week if  still having symptoms Patient not taking: Reported on 05/12/2017 06/16/16   Everlene Farrier, PA-C  promethazine (PHENERGAN) 25 MG tablet Take 1 tablet (25 mg total) by mouth every 6 (six) hours as needed for nausea or vomiting. Patient not taking: Reported on 05/12/2017 02/25/16   Ward, Layla Maw, DO    Family History Family History  Problem Relation Age of Onset  . Alcohol abuse Mother   . Cancer Paternal Grandmother     Social History Social History   Tobacco Use  . Smoking  status: Current Every Day Smoker    Types: Cigarettes  . Smokeless tobacco: Never Used  Substance Use Topics  . Alcohol use: Yes  . Drug use: Yes    Types: Marijuana     Allergies   Patient has no known allergies.   Review of Systems Review of Systems  Constitutional: Positive for activity change.  HENT: Positive for sinus pain and voice change. Negative for sore throat.   Respiratory: Positive for cough and shortness of breath.   Cardiovascular: Positive for chest pain.  Gastrointestinal: Positive for nausea and vomiting.  Allergic/Immunologic: Negative for immunocompromised state.  Hematological: Does not bruise/bleed easily.  All other systems reviewed and are negative.    Physical Exam Updated Vital Signs BP 117/81   Pulse (!) 112 Comment: At rest  Temp 100.1 F (37.8 C) (Oral)   Resp 16   Ht 5\' 2"  (1.575 m)   Wt 52.2 kg (115 lb)   LMP 04/28/2017   SpO2 99%   BMI 21.03 kg/m   Physical Exam  Constitutional: She is oriented to person, place, and time. She appears well-developed.  HENT:  Head: Normocephalic and atraumatic.  Eyes: EOM are normal.  Neck: Normal range of motion. Neck supple.  Cardiovascular: Exam reveals no friction rub.  No murmur heard. tachycardia  Pulmonary/Chest: Effort normal.  Abdominal: Bowel sounds are normal.  Musculoskeletal: She exhibits no edema or tenderness.  Neurological: She is alert and oriented to person, place, and time.  Skin: Skin is warm and dry.  Nursing note and vitals reviewed.    ED Treatments / Results  Labs (all labs ordered are listed, but only abnormal results are displayed) Labs Reviewed  COMPREHENSIVE METABOLIC PANEL - Abnormal; Notable for the following components:      Result Value   CO2 19 (*)    Glucose, Bld 103 (*)    All other components within normal limits  URINALYSIS, ROUTINE W REFLEX MICROSCOPIC - Abnormal; Notable for the following components:   Ketones, ur 80 (*)    All other components  within normal limits  INFLUENZA PANEL BY PCR (TYPE A & B) - Abnormal; Notable for the following components:   Influenza A By PCR POSITIVE (*)    All other components within normal limits  LIPASE, BLOOD  CBC  BRAIN NATRIURETIC PEPTIDE  TROPONIN I  RAPID URINE DRUG SCREEN, HOSP PERFORMED  I-STAT BETA HCG BLOOD, ED ([facility], [facility], [facility] ONLY)    EKG  EKG Interpretation None       Radiology Dg Chest 2 View  Result Date: 05/12/2017 CLINICAL DATA:  Cough, chest pain, and shortness of breath. EXAM: CHEST  2 VIEW COMPARISON:  Chest x-ray dated February 20, 2014. FINDINGS: The heart size and mediastinal contours are within normal limits. Both lungs are clear. The visualized skeletal structures are unremarkable. IMPRESSION: Normal chest x-ray. Electronically Signed   By: Obie Dredge M.D.   On: 05/12/2017 13:00    Procedures Procedures (  including critical care time)  Medications Ordered in ED Medications  LORazepam (ATIVAN) tablet 1 mg (not administered)     Initial Impression / Assessment and Plan / ED Course  I have reviewed the triage vital signs and the nursing notes.  Pertinent labs & imaging results that were available during my care of the patient were reviewed by me and considered in my medical decision making (see chart for details).  Clinical Course as of May 12 1710  Tue May 12, 2017  1654 Patient is afraid of needles and has refused the fluids, and IV antianxiety medications. Patient has already passed oral challenge. Patient's exam is unchanged, she continues to have slightly labored respirations, and tachycardia.  Patient also spiked a low-grade fever.  Ambulatory pulse ox revealed O2 sats dropping to 95%, and heart rate staying in the 110-120 range.  Troponin is normal, BNP is fine. EKG will be ordered.  Anticipate patient going home.  [AN]  1712 Flu is positive. Results from the ER workup discussed with the patient face to face and all questions answered to the best of  my ability.  Strict ER return precautions have been discussed, and patient is agreeing with the plan and is comfortable with the workup done and the recommendations from the ER.    [AN]    Clinical Course User Index [AN] Derwood KaplanNanavati, Khadijah Mastrianni, MD    22 year old female comes in with chief complaint of shortness of breath.  She is also having flulike symptoms, and I suspect that she is having underlying influenza for all of her symptoms.  Of concerning is the fact that patient is noted to be noticeably labored on her breathing, even at rest.  She is also having orthopnea-like symptoms along with pleuritic chest pain.  Suspicion for PE is low.  Chest x-ray ordered as not showing any acute infiltrates or pneumothorax.  Patient's hemoglobin is normal.  It is possible that patient is having complications from her flu, which includes myocarditis.  Troponin has been added to her workup.  Anxiety is also possible, and she will get Versed.  Labs also show profound ketonuria so we will hydrate patient in the ED.  Disposition will be made upon reassessment of the patient.  Since there is a small possibility that she might need admission we have sent out flu swab.  Final Clinical Impressions(s) / ED Diagnoses   Final diagnoses:  Acute dyspnea  Acute respiratory distress    ED Discharge Orders    None       Derwood KaplanNanavati, Tiwana Chavis, MD 05/12/17 1656    Derwood KaplanNanavati, Tonatiuh Mallon, MD 05/12/17 1724

## 2017-05-12 NOTE — ED Notes (Signed)
Pt does not want IV

## 2017-05-12 NOTE — ED Triage Notes (Signed)
Patient c/o body aches, cough, v/d, fevers x 4 days. Reports taking ibuprofen 200mg  2 hours ago.

## 2018-09-12 ENCOUNTER — Encounter (HOSPITAL_COMMUNITY): Payer: Self-pay

## 2018-09-12 ENCOUNTER — Telehealth: Payer: Self-pay | Admitting: Family

## 2018-09-12 ENCOUNTER — Emergency Department (HOSPITAL_COMMUNITY)
Admission: EM | Admit: 2018-09-12 | Discharge: 2018-09-12 | Disposition: A | Discharge status: Home / Self Care | Payer: Self-pay | Attending: Emergency Medicine | Admitting: Emergency Medicine

## 2018-09-12 ENCOUNTER — Other Ambulatory Visit: Payer: Self-pay

## 2018-09-12 ENCOUNTER — Emergency Department (HOSPITAL_COMMUNITY): Payer: Self-pay

## 2018-09-12 ENCOUNTER — Ambulatory Visit (INDEPENDENT_AMBULATORY_CARE_PROVIDER_SITE_OTHER)
Admission: RE | Admit: 2018-09-12 | Discharge: 2018-09-12 | Disposition: A | Discharge status: Home / Self Care | Payer: Self-pay | Source: Ambulatory Visit

## 2018-09-12 DIAGNOSIS — R5383 Other fatigue: Secondary | ICD-10-CM

## 2018-09-12 DIAGNOSIS — R109 Unspecified abdominal pain: Secondary | ICD-10-CM

## 2018-09-12 DIAGNOSIS — E860 Dehydration: Secondary | ICD-10-CM | POA: Insufficient documentation

## 2018-09-12 DIAGNOSIS — R197 Diarrhea, unspecified: Secondary | ICD-10-CM | POA: Insufficient documentation

## 2018-09-12 DIAGNOSIS — R55 Syncope and collapse: Secondary | ICD-10-CM | POA: Insufficient documentation

## 2018-09-12 DIAGNOSIS — R110 Nausea: Secondary | ICD-10-CM | POA: Insufficient documentation

## 2018-09-12 DIAGNOSIS — F17210 Nicotine dependence, cigarettes, uncomplicated: Secondary | ICD-10-CM | POA: Insufficient documentation

## 2018-09-12 DIAGNOSIS — R519 Headache, unspecified: Secondary | ICD-10-CM

## 2018-09-12 DIAGNOSIS — R42 Dizziness and giddiness: Secondary | ICD-10-CM

## 2018-09-12 DIAGNOSIS — Z79899 Other long term (current) drug therapy: Secondary | ICD-10-CM | POA: Insufficient documentation

## 2018-09-12 DIAGNOSIS — R51 Headache: Secondary | ICD-10-CM | POA: Insufficient documentation

## 2018-09-12 DIAGNOSIS — R531 Weakness: Secondary | ICD-10-CM | POA: Insufficient documentation

## 2018-09-12 DIAGNOSIS — N898 Other specified noninflammatory disorders of vagina: Secondary | ICD-10-CM | POA: Insufficient documentation

## 2018-09-12 LAB — URINALYSIS, ROUTINE W REFLEX MICROSCOPIC
Bilirubin Urine: NEGATIVE
Glucose, UA: NEGATIVE mg/dL
Hgb urine dipstick: NEGATIVE
Ketones, ur: 5 mg/dL — AB
Leukocytes,Ua: NEGATIVE
Nitrite: NEGATIVE
Protein, ur: NEGATIVE mg/dL
Specific Gravity, Urine: 1.02 (ref 1.005–1.030)
pH: 6 (ref 5.0–8.0)

## 2018-09-12 LAB — CBC WITH DIFFERENTIAL/PLATELET
Abs Immature Granulocytes: 0.04 10*3/uL (ref 0.00–0.07)
Basophils Absolute: 0 10*3/uL (ref 0.0–0.1)
Basophils Relative: 0 %
Eosinophils Absolute: 0 10*3/uL (ref 0.0–0.5)
Eosinophils Relative: 0 %
HCT: 42.5 % (ref 36.0–46.0)
Hemoglobin: 13.5 g/dL (ref 12.0–15.0)
Immature Granulocytes: 0 %
Lymphocytes Relative: 33 %
Lymphs Abs: 3.3 10*3/uL (ref 0.7–4.0)
MCH: 30.3 pg (ref 26.0–34.0)
MCHC: 31.8 g/dL (ref 30.0–36.0)
MCV: 95.5 fL (ref 80.0–100.0)
Monocytes Absolute: 0.7 10*3/uL (ref 0.1–1.0)
Monocytes Relative: 8 %
Neutro Abs: 5.7 10*3/uL (ref 1.7–7.7)
Neutrophils Relative %: 59 %
Platelets: 296 10*3/uL (ref 150–400)
RBC: 4.45 MIL/uL (ref 3.87–5.11)
RDW: 12.8 % (ref 11.5–15.5)
WBC: 9.7 10*3/uL (ref 4.0–10.5)
nRBC: 0 % (ref 0.0–0.2)

## 2018-09-12 LAB — COMPREHENSIVE METABOLIC PANEL
ALT: 10 U/L (ref 0–44)
AST: 14 U/L — ABNORMAL LOW (ref 15–41)
Albumin: 5 g/dL (ref 3.5–5.0)
Alkaline Phosphatase: 47 U/L (ref 38–126)
Anion gap: 11 (ref 5–15)
BUN: 13 mg/dL (ref 6–20)
CO2: 24 mmol/L (ref 22–32)
Calcium: 9.4 mg/dL (ref 8.9–10.3)
Chloride: 106 mmol/L (ref 98–111)
Creatinine, Ser: 0.75 mg/dL (ref 0.44–1.00)
GFR calc Af Amer: >60 mL/min (ref >60)
GFR calc non Af Amer: >60 mL/min (ref >60)
Glucose, Bld: 107 mg/dL — ABNORMAL HIGH (ref 70–99)
Potassium: 3.9 mmol/L (ref 3.5–5.1)
Sodium: 141 mmol/L (ref 135–145)
Total Bilirubin: 0.6 mg/dL (ref 0.3–1.2)
Total Protein: 8.1 g/dL (ref 6.5–8.1)

## 2018-09-12 LAB — I-STAT BETA HCG BLOOD, ED (MC, WL, AP ONLY): I-stat hCG, quantitative: <5 m[IU]/mL (ref <5)

## 2018-09-12 LAB — RAPID URINE DRUG SCREEN, HOSP PERFORMED
Amphetamines: NOT DETECTED
Barbiturates: NOT DETECTED
Benzodiazepines: NOT DETECTED
Cocaine: NOT DETECTED
Opiates: NOT DETECTED
Tetrahydrocannabinol: POSITIVE — AB

## 2018-09-12 LAB — MAGNESIUM: Magnesium: 2.3 mg/dL (ref 1.7–2.4)

## 2018-09-12 MED ORDER — ONDANSETRON HCL 4 MG/2ML IJ SOLN
4.0000 mg | Freq: Once | INTRAMUSCULAR | Status: AC
  Administered 2018-09-12: 18:00:00 4 mg via INTRAVENOUS
  Filled 2018-09-12: qty 2

## 2018-09-12 MED ORDER — SODIUM CHLORIDE 0.9 % IV BOLUS
1000.0000 mL | Freq: Once | INTRAVENOUS | Status: AC
  Administered 2018-09-12: 1000 mL via INTRAVENOUS

## 2018-09-12 NOTE — Progress Notes (Signed)
Based on what you shared with me, I feel your condition warrants further evaluation and I recommend that you be seen for a face to face office visit.  NOTE: If you entered your credit card information for this eVisit, you will not be charged. You may see a "hold" on your card for the $35 but that hold will drop off and you will not have a charge processed.  If you are having a true medical emergency please call 911.    Based on your symptoms you need to be seen face-to-face. I do not see your results from your Covid testing and in your chart.  For an urgent face to face visit, [facility] has five urgent care centers for your convenience:    [email or web address] to reserve your spot online an avoid wait times  [facility] [address] [place], [place] [address] Modified hours of operation: Monday-Friday, 12 PM to 6 PM  Closed Saturday & Sunday  *Across the street from [place] (New Address!) [address] [place], [place] [address] *Just off [address], across the road from [place] hours of operation: Monday-Friday, 12 PM to 6 PM  Closed Saturday & Sunday   The following sites will take your insurance:  . [facility]    [phone]                  Get Driving Directions  [address], [place] [address] . 10 am to 8 pm Monday-Friday . 12 pm to 8 pm Saturday-Sunday   . [facility] at [place]                  Get Driving Directions  [address], [place], [place] [address] . 8 am to 8 pm Monday-Friday . 9 am to 6 pm Saturday . 11 am to 6 pm Sunday   . [facility] at [place]                  Get Driving Directions   [address] [place], [place] [address] . 8 am to 8 pm Monday-Friday . 8 am to 4 pm Saturday-Sunday    . [facility] Ur[facility]  Care at [place]                    Get Driving Directions  [phone]  [address], [place], [place] [address]  . Monday-Friday, 12 PM to 6 PM    Your e-visit answers were reviewed by a board certified advanced clinical practitioner to complete your personal care plan.  Thank you for using e-Visits.

## 2018-09-12 NOTE — Discharge Instructions (Signed)
I do recommend in person evaluation for your symptoms, at minimum to rule out sources of your symptoms such as anemia, pancreatitis or dehydration.  Please go to the ER for further evaluation.

## 2018-09-12 NOTE — Discharge Instructions (Signed)
Dehydration ° °We suspect you may be dehydrated.  °Symptoms of any other illness will be intensified and complicated by dehydration. Dehydration can also extend the duration of symptoms. Dehydration typically causes its own symptoms including lightheadedness, nausea, headaches, fatigue increased thirst, and generally feeling unwell. Drink plenty of fluids and get plenty of rest. You should be drinking at least half a liter of water every hour or two to stay hydrated. Electrolyte drinks (ex. Gatorade, Powerade, Pedialyte) are also encouraged. You should be drinking enough fluids to make your urine light yellow, almost clear. If this is not the case, you are not drinking enough water. °

## 2018-09-12 NOTE — ED Provider Notes (Signed)
[facility]-EMERGENCY DEPT Provider Note   CSN: [id number] Arrival date & time: 09/12/18  1449    History   Chief Complaint Chief Complaint  Patient presents with  . Dizziness    HPI Kristi CornerJenna M Saari is a 23 y.o. female.     HPI   Kristi Harmon is a 23 y.o. female, with a history of anemia and pancreatitis, presenting to the ED with episodes of lightheadedness intermittently for the last 5 days.  June 23 patient states she was at work, seated, began to feel hot, shaky, nauseous, and lightheaded.  She walked into the freezer to cool off, sat down, and then sustained a syncopal episode.  She does not know how long she was unconscious.  EMS was called, but she declined transport at that time.  She has had several of these episodes since then.  She has not noted a pattern with which they occur.  She states she has not experienced these symptoms before.  In general for the last week she has felt "off," fatigued, nauseous, generally weak, intermittent headaches.  She also notes soft stool, which she refers to as diarrhea, for the last 2 weeks.  She has had one such stool daily.  LMP 6/15.  Due to her symptoms, she took a home pregnancy test, which was negative.  Of note, patient was previously using NuvaRing for contraception, however, ran out of her prescription and has not been using any form of birth control for the last 2 months at least.  She is sexually active with one female partner with whom she has been in a relationship for the last 2 years. COVID-19 testing performed 6/26, results pending. No change in diet.  She does not take any medications or supplements.  She typically drinks alcohol about once a week, but has not had any alcohol for the last month at least.  She smokes marijuana daily.  There has been no change in supplier and she has had her current batch for a couple weeks.  Denies fever/chillls, head injuries/trauma, chest pain, shortness of breath, cough,  abdominal pain, hematochezia/melena, palpitations, urinary symptoms, or any other complaints.   Past Medical History:  Diagnosis Date  . Anemia   . Pancreatitis     There are no active problems to display for this patient.   Past Surgical History:  Procedure Laterality Date  . WISDOM TOOTH EXTRACTION       OB History   No obstetric history on file.      Home Medications    Prior to Admission medications   Medication Sig Start Date End Date Taking? Authorizing Provider  Ibuprofen-diphenhydrAMINE HCl (IBUPROFEN PM) 200-25 MG CAPS Take 2 capsules by mouth at bedtime as needed (sleep).   Yes [provider]  etonogestrel-ethinyl estradiol (NUVARING) 0.12-0.015 MG/24HR vaginal ring Place 1 each vaginally every 28 (twenty-eight) days. Insert vaginally and leave in place for 3 consecutive weeks, then remove for 1 week.    [provider]  promethazine (PHENERGAN) 25 MG tablet Take 1 tablet (25 mg total) by mouth every 6 (six) hours as needed for nausea or vomiting. Patient not taking: Reported on 05/12/2017 02/25/16 09/12/18  Ward, Layla MawKristen N, DO    Family History Family History  Problem Relation Age of Onset  . Alcohol abuse Mother   . Cancer Paternal Grandmother     Social History Social History   Tobacco Use  . Smoking status: Current Every Day Smoker    Types: Cigarettes  .  Smokeless tobacco: Never Used  Substance Use Topics  . Alcohol use: Yes  . Drug use: Yes    Types: Marijuana     Allergies   Patient has no known allergies.   Review of Systems Review of Systems  Constitutional: Positive for fatigue. Negative for appetite change and fever.  Respiratory: Negative for shortness of breath.   Cardiovascular: Negative for chest pain, palpitations and leg swelling.  Gastrointestinal: Positive for diarrhea and nausea. Negative for abdominal pain, blood in stool and vomiting.  Genitourinary: Negative for dysuria and hematuria.  Musculoskeletal:  Negative for back pain, neck pain and neck stiffness.  Skin: Negative for rash.  Neurological: Positive for syncope (a few days ago), weakness (generalized), light-headedness and headaches (intermittent). Negative for numbness.  All other systems reviewed and are negative.    Physical Exam Updated Vital Signs BP 121/87 (BP Location: Right Arm)   Pulse 70   Temp 98.6 F (37 C) (Oral)   Resp 16   LMP 08/29/2018 (Exact Date)   SpO2 100%   Physical Exam Vitals signs and nursing note reviewed.  Constitutional:      General: She is not in acute distress.    Appearance: She is well-developed. She is not diaphoretic.     Comments: During my physical exam, after asked the patient to sit upright, she began to feel lightheaded, "hot all over," and nauseous.  This lasted for several minutes and then resolved. Patient had no syncope, confusion, lack of coordination, seizure activity, pallor.  HENT:     Head: Normocephalic and atraumatic.     Nose: Nose normal.     Mouth/Throat:     Mouth: Mucous membranes are moist.     Pharynx: Oropharynx is clear.  Eyes:     Extraocular Movements: Extraocular movements intact.     Conjunctiva/sclera: Conjunctivae normal.     Pupils: Pupils are equal, round, and reactive to light.  Neck:     Musculoskeletal: Normal range of motion and neck supple. No neck rigidity.  Cardiovascular:     Rate and Rhythm: Normal rate and regular rhythm.     Pulses: Normal pulses.          Radial pulses are 2+ on the right side and 2+ on the left side.       Posterior tibial pulses are 2+ on the right side and 2+ on the left side.     Heart sounds: Normal heart sounds.     Comments: Tactile temperature in the extremities appropriate and equal bilaterally. Pulmonary:     Effort: Pulmonary effort is normal. No respiratory distress.     Breath sounds: Normal breath sounds.  Abdominal:     Palpations: Abdomen is soft.     Tenderness: There is no abdominal tenderness.  There is no guarding.  Musculoskeletal:     Right lower leg: No edema.     Left lower leg: No edema.  Lymphadenopathy:     Cervical: No cervical adenopathy.  Skin:    General: Skin is warm and dry.  Neurological:     Mental Status: She is alert and oriented to person, place, and time.     Comments: Sensation grossly intact to light touch in the extremities.  Grip strengths equal bilaterally.  Strength 5/5 in all extremities. No gait disturbance. Coordination intact. Cranial nerves III-XII grossly intact. No facial droop.   Psychiatric:        Mood and Affect: Mood and affect normal.  Speech: Speech normal.        Behavior: Behavior normal.      ED Treatments / Results  Labs (all labs ordered are listed, but only abnormal results are displayed) Labs Reviewed  URINALYSIS, ROUTINE W REFLEX MICROSCOPIC - Abnormal; Notable for the following components:      Result Value   APPearance HAZY (*)    Ketones, ur 5 (*)    All other components within normal limits  COMPREHENSIVE METABOLIC PANEL - Abnormal; Notable for the following components:   Glucose, Bld 107 (*)    AST 14 (*)    All other components within normal limits  RAPID URINE DRUG SCREEN, HOSP PERFORMED - Abnormal; Notable for the following components:   Tetrahydrocannabinol POSITIVE (*)    All other components within normal limits  CBC WITH DIFFERENTIAL/PLATELET  MAGNESIUM  I-STAT BETA HCG BLOOD, ED ([facility], [facility], [facility] ONLY)    EKG EKG Interpretation  Date/Time:  Sunday September 12 2018 18:59:40 EDT Ventricular Rate:  72 PR Interval:    QRS Duration: 90 QT Interval:  380 QTC Calculation: 416 R Axis:   77 Text Interpretation:  Sinus arrhythmia Since last tracing of earlier today No significant change was found Confirmed by Daleen Bo [phone]) on 09/12/2018 7:55:34 PM   Radiology Dg Chest 2 View  Result Date: 09/12/2018 CLINICAL DATA:  Syncopal episode. EXAM: CHEST - 2 VIEW COMPARISON:  05/12/2017 FINDINGS: The heart  size and mediastinal contours are within normal limits. Both lungs are clear. The visualized skeletal structures are unremarkable. IMPRESSION: Negative.  No active cardiopulmonary disease. Electronically Signed   By: Marlaine Hind M.D.   On: 09/12/2018 18:38    Procedures Procedures (including critical care time)  Medications Ordered in ED Medications  sodium chloride 0.9 % bolus 1,000 mL (0 mLs Intravenous Stopped 09/12/18 1920)  ondansetron (ZOFRAN) injection 4 mg (4 mg Intravenous Given 09/12/18 1756)     Initial Impression / Assessment and Plan / ED Course  I have reviewed the triage vital signs and the nursing notes.  Pertinent labs & imaging results that were available during my care of the patient were reviewed by me and considered in my medical decision making (see chart for details).  Clinical Course as of Sep 11 2012  Sun Sep 12, 2018  3536 Patient states she feels much better.   [SJ]    Clinical Course User Index [SJ] Ryker Sudbury C, PA-C       Patient presents with intermittent episodes of lightheadedness.  She had one such episode with change in position here in the ED.  This resolved without recurrence. Patient is nontoxic appearing, afebrile, not tachycardic, not tachypneic, not hypotensive, maintains excellent SPO2 on room air.  Some mild ketonuria may be due to some dehydration.  This was discussed with the patient.  Other lab work, chest x-ray, and EKG were reassuring.  The patient was given instructions for home care as well as return precautions. Patient voices understanding of these instructions, accepts the plan, and is comfortable with discharge.  Patient told triage RN she was experiencing vaginal discharge.  For the last week, she has had increased vaginal discharge that has ranged from "dark white" to yellow or brown.  I suggested pelvic exam with swabs.  Patient consider this, but ultimately stated she would follow-up with OB/GYN on this matter.   Vitals:    09/12/18 1607 09/12/18 1830 09/12/18 2007  BP: 121/87 118/85 111/72  Pulse: 70 63 72  Resp: 16 18  18  Temp: 98.6 F (37 C)    TempSrc: Oral    SpO2: 100% 100% 99%     Final Clinical Impressions(s) / ED Diagnoses   Final diagnoses:  Dehydration  Lightheadedness    ED Discharge Orders    None       Concepcion LivingJoy, Anab Vivar C, PA-C 09/12/18 2017    Mancel BaleWentz, Elliott, MD 09/14/18 1740

## 2018-09-12 NOTE — ED Notes (Signed)
Pt ambulated to the bathroom without any assistance. Gait steady  

## 2018-09-12 NOTE — ED Provider Notes (Signed)
Virtual Visit via Video Note:  Kristi Harmon  initiated request for Telemedicine visit with [facility] team. I connected with Kristi Harmon  on 09/12/2018 at 2:25 PM  for a synchronized telemedicine visit using a video enabled HIPPA compliant telemedicine application. I verified that I am speaking with Kristi Harmon  using two identifiers. Kristi HaberNatalie B Abbigal Radich, NP  was physically located in a [facility] site and Kristi Harmon was located at a different location.   The limitations of evaluation and management by telemedicine as well as the availability of in-person appointments were discussed. Patient was informed that she  may incur a bill ( including co-pay) for this virtual visit encounter. Kristi Harmon  expressed understanding and gave verbal consent to proceed with virtual visit.     History of Present Illness:Kristi Harmon  is a 23 y.o. female presents with complaints of dizziness and nausea. 6/23- sensation of nausea, hot. Sat down at first but then walked to the freezer at work to try to cool off. Felt shaky, tingling and couldn't move fingers. Lost consciousness and 911 was called. She couldn't move off the ground when EMS was at the scene. Was shaking. Felt hot, cold and dizzy. She went into the ambulance but declined transport to ER. Dizzy spells have been intermittent since. Has felt sensation of being hot. Has still had some nausea and shaking. Took HPT which was negative. Period ended 1 week ago. Covid-19 testing pending, completed 6/26. No known fevers. No shortness of breath  Or chest pain . But with initial episode at work that day felt like she couldn't breathe. Hasn't been able to sleep. Feels like she has muscle spasms, worse at night. Has had hiccups frequently. Has had headaches. No head injury. No cough. She does smoke- so occasionally coughs with this. No leg pain or swelling. No oral birth control. Not on any medication. No recent travel. Has had some runny nose  and sore throat. Sore throat has improved. No ear pain. Occasional left flank pain, comes and goes and is mild. Increased urination. Vaginal discharge has increased. Hx of pancreatitis a few years ago, some symptoms are similar, but doesn't have the same pain. She does feel fatigued. Hasn't drank alcohol in weeks. Has been drinking more soda and fatty foods recently. She works at a coffee shop/ bakery, has been working throughout the pandemic. A coworker had a fever two weeks ago but she was not in contact with her, and the person tested negative for covid. No other known ill contacts.   Past Medical History:  Diagnosis Date  . Anemia   . Pancreatitis     No Known Allergies      Observations/Objective: Alert, oriented, non toxic in appearance. Clear coherent speech without difficulty. No increased work of breathing visualized.    Assessment and Plan: Passed out a few days ago, still with fatigue, dizziness, some left sided flank pain. Anemia, vs pe vs pancreatitis vs pregnancy all discussed and considered. I do recommend in person visit to further evaluate patient. Patient verbalized understanding and agreeable to plan, stating she will seek further care.   Follow Up Instructions:    I discussed the assessment and treatment plan with the patient. The patient was provided an opportunity to ask questions and all were answered. The patient agreed with the plan and demonstrated an understanding of the instructions.   The patient was advised to call back or seek an in-person  evaluation if the symptoms worsen or if the condition fails to improve as anticipated.  I provided 17 minutes of non-face-to-face time during this encounter.    Zigmund Gottron, NP  09/12/2018 2:25 PM         Zigmund Gottron, NP 09/12/18 1426

## 2018-09-12 NOTE — ED Notes (Signed)
Pt verbalized discharge instructions and follow up care. Alert and ambulatory. No IV.  

## 2018-09-12 NOTE — ED Triage Notes (Signed)
She states that earlier this week she experienced a near-syncope at work. She was seen at a minor emerg. And they performed COVID testing, which "isn't back yet". She also c/o mild abd. Discomfort and some vaginal d/c. She is in no distress.

## 2018-09-29 ENCOUNTER — Encounter (HOSPITAL_COMMUNITY): Payer: Self-pay

## 2018-09-29 ENCOUNTER — Other Ambulatory Visit: Payer: Self-pay

## 2018-09-29 ENCOUNTER — Emergency Department (HOSPITAL_COMMUNITY)
Admission: EM | Admit: 2018-09-29 | Discharge: 2018-09-29 | Disposition: A | Discharge status: Home / Self Care | Payer: Self-pay | Attending: Emergency Medicine | Admitting: Emergency Medicine

## 2018-09-29 ENCOUNTER — Emergency Department (HOSPITAL_COMMUNITY): Payer: Self-pay

## 2018-09-29 DIAGNOSIS — R1084 Generalized abdominal pain: Secondary | ICD-10-CM | POA: Insufficient documentation

## 2018-09-29 DIAGNOSIS — R42 Dizziness and giddiness: Secondary | ICD-10-CM | POA: Insufficient documentation

## 2018-09-29 DIAGNOSIS — F17210 Nicotine dependence, cigarettes, uncomplicated: Secondary | ICD-10-CM | POA: Insufficient documentation

## 2018-09-29 DIAGNOSIS — R112 Nausea with vomiting, unspecified: Secondary | ICD-10-CM | POA: Insufficient documentation

## 2018-09-29 LAB — CBC WITH DIFFERENTIAL/PLATELET
Abs Immature Granulocytes: 0.01 10*3/uL (ref 0.00–0.07)
Basophils Absolute: 0 10*3/uL (ref 0.0–0.1)
Basophils Relative: 0 %
Eosinophils Absolute: 0 10*3/uL (ref 0.0–0.5)
Eosinophils Relative: 0 %
HCT: 40.5 % (ref 36.0–46.0)
Hemoglobin: 13.2 g/dL (ref 12.0–15.0)
Immature Granulocytes: 0 %
Lymphocytes Relative: 21 %
Lymphs Abs: 1.5 10*3/uL (ref 0.7–4.0)
MCH: 31 pg (ref 26.0–34.0)
MCHC: 32.6 g/dL (ref 30.0–36.0)
MCV: 95.1 fL (ref 80.0–100.0)
Monocytes Absolute: 0.7 10*3/uL (ref 0.1–1.0)
Monocytes Relative: 10 %
Neutro Abs: 5 10*3/uL (ref 1.7–7.7)
Neutrophils Relative %: 69 %
Platelets: 281 10*3/uL (ref 150–400)
RBC: 4.26 MIL/uL (ref 3.87–5.11)
RDW: 13.2 % (ref 11.5–15.5)
WBC: 7.2 10*3/uL (ref 4.0–10.5)
nRBC: 0 % (ref 0.0–0.2)

## 2018-09-29 LAB — COMPREHENSIVE METABOLIC PANEL
ALT: 11 U/L (ref 0–44)
AST: 12 U/L — ABNORMAL LOW (ref 15–41)
Albumin: 4.7 g/dL (ref 3.5–5.0)
Alkaline Phosphatase: 40 U/L (ref 38–126)
Anion gap: 10 (ref 5–15)
BUN: 6 mg/dL (ref 6–20)
CO2: 27 mmol/L (ref 22–32)
Calcium: 9.5 mg/dL (ref 8.9–10.3)
Chloride: 105 mmol/L (ref 98–111)
Creatinine, Ser: 0.69 mg/dL (ref 0.44–1.00)
GFR calc Af Amer: >60 mL/min (ref >60)
GFR calc non Af Amer: >60 mL/min (ref >60)
Glucose, Bld: 110 mg/dL — ABNORMAL HIGH (ref 70–99)
Potassium: 3.8 mmol/L (ref 3.5–5.1)
Sodium: 142 mmol/L (ref 135–145)
Total Bilirubin: 0.4 mg/dL (ref 0.3–1.2)
Total Protein: 7.8 g/dL (ref 6.5–8.1)

## 2018-09-29 LAB — URINALYSIS, ROUTINE W REFLEX MICROSCOPIC
Bilirubin Urine: NEGATIVE
Glucose, UA: NEGATIVE mg/dL
Hgb urine dipstick: NEGATIVE
Ketones, ur: NEGATIVE mg/dL
Leukocytes,Ua: NEGATIVE
Nitrite: NEGATIVE
Protein, ur: NEGATIVE mg/dL
Specific Gravity, Urine: 1.002 — ABNORMAL LOW (ref 1.005–1.030)
pH: 7 (ref 5.0–8.0)

## 2018-09-29 LAB — I-STAT BETA HCG BLOOD, ED (MC, WL, AP ONLY): I-stat hCG, quantitative: <5 m[IU]/mL (ref <5)

## 2018-09-29 LAB — LIPASE, BLOOD: Lipase: 61 U/L — ABNORMAL HIGH (ref 11–51)

## 2018-09-29 MED ORDER — KETOROLAC TROMETHAMINE 30 MG/ML IJ SOLN
30.0000 mg | Freq: Once | INTRAMUSCULAR | Status: AC
  Administered 2018-09-29: 30 mg via INTRAVENOUS
  Filled 2018-09-29: qty 1

## 2018-09-29 MED ORDER — PROMETHAZINE HCL 25 MG/ML IJ SOLN
25.0000 mg | Freq: Once | INTRAMUSCULAR | Status: AC
  Administered 2018-09-29: 25 mg via INTRAVENOUS
  Filled 2018-09-29: qty 1

## 2018-09-29 MED ORDER — ONDANSETRON 4 MG PO TBDP: 4.0000 mg | ORAL_TABLET | Freq: Three times a day (TID) | ORAL | 0 refills | Status: DC | PRN

## 2018-09-29 MED ORDER — SODIUM CHLORIDE (PF) 0.9 % IJ SOLN
INTRAMUSCULAR | Status: AC
  Filled 2018-09-29: qty 50

## 2018-09-29 MED ORDER — LORAZEPAM 2 MG/ML IJ SOLN
1.0000 mg | Freq: Once | INTRAMUSCULAR | Status: AC
  Administered 2018-09-29: 1 mg via INTRAVENOUS
  Filled 2018-09-29: qty 1

## 2018-09-29 MED ORDER — IOHEXOL 300 MG/ML  SOLN
100.0000 mL | Freq: Once | INTRAMUSCULAR | Status: AC | PRN
  Administered 2018-09-29: 100 mL via INTRAVENOUS

## 2018-09-29 MED ORDER — MORPHINE SULFATE (PF) 4 MG/ML IV SOLN
4.0000 mg | Freq: Once | INTRAVENOUS | Status: AC
  Administered 2018-09-29: 4 mg via INTRAVENOUS
  Filled 2018-09-29: qty 1

## 2018-09-29 MED ORDER — SODIUM CHLORIDE 0.9 % IV BOLUS
1000.0000 mL | Freq: Once | INTRAVENOUS | Status: AC
  Administered 2018-09-29: 1000 mL via INTRAVENOUS

## 2018-09-29 MED ORDER — IOHEXOL 300 MG/ML  SOLN
30.0000 mL | Freq: Once | INTRAMUSCULAR | Status: AC
  Administered 2018-09-29: 30 mL via ORAL

## 2018-09-29 MED ORDER — ONDANSETRON HCL 4 MG/2ML IJ SOLN
4.0000 mg | Freq: Once | INTRAMUSCULAR | Status: AC
  Administered 2018-09-29: 4 mg via INTRAVENOUS
  Filled 2018-09-29: qty 2

## 2018-09-29 MED ORDER — DICYCLOMINE HCL 20 MG PO TABS: 20.0000 mg | ORAL_TABLET | Freq: Two times a day (BID) | ORAL | 0 refills | Status: DC

## 2018-09-29 NOTE — ED Provider Notes (Signed)
[facility] DEPT Provider Note   CSN: [id number] Arrival date & time: 09/29/18  0705    History   Chief Complaint Chief Complaint  Patient presents with   Abdominal Pain    HPI Kristi Harmon is a 23 y.o. female with PMH/o anemia or pancreatitis who presents for evaluation of generalized abdominal pain, nausea/vomiting, diarrhea that is been ongoing for last 3 days.  She states that symptoms began after she ate raw oysters from a restaurant.  She reports that since then, she has had multiple episodes of nonbloody, nonbilious vomiting.  She also endorses some episodes of nonbloody diarrhea. She reports diffuse abdominal pain. She has taken OTC tums and zofran with no improvement. She was seen by a telehealth doctor who prescribed zofran. She reports she continues to have pain, nausea/vomiting despite medications. She feels like she had some subjective fever/chills last night but did not actually measure a temperature. She reports she's had some ongoing intermittent feelings of lightheadedness. She has been seen for this previously. Patient denies any fever, CP, SOB, numbness/weakness, dysuria, hematuria.       The history is provided by the patient.    Past Medical History:  Diagnosis Date   Anemia    Pancreatitis     There are no active problems to display for this patient.   Past Surgical History:  Procedure Laterality Date   WISDOM TOOTH EXTRACTION       OB History   No obstetric history on file.      Home Medications    Prior to Admission medications   Medication Sig Start Date End Date Taking? Authorizing Provider  calcium carbonate (TUMS EX) 750 MG chewable tablet Chew 1 tablet by mouth 3 (three) times daily as needed for heartburn.   Yes [provider]  ondansetron (ZOFRAN) 4 MG tablet Take 8 mg by mouth 2 (two) times daily as needed for nausea or vomiting.  09/27/18  Yes [provider]  dicyclomine (BENTYL)  20 MG tablet Take 1 tablet (20 mg total) by mouth 2 (two) times daily. 09/29/18   Volanda Napoleon, PA-C  ondansetron (ZOFRAN ODT) 4 MG disintegrating tablet Take 1 tablet (4 mg total) by mouth every 8 (eight) hours as needed for nausea or vomiting. 09/29/18   Volanda Napoleon, PA-C  promethazine (PHENERGAN) 25 MG tablet Take 1 tablet (25 mg total) by mouth every 6 (six) hours as needed for nausea or vomiting. Patient not taking: Reported on 05/12/2017 02/25/16 09/12/18  Ward, Delice Bison, DO    Family History Family History  Problem Relation Age of Onset   Alcohol abuse Mother    Cancer Paternal Grandmother     Social History Social History   Tobacco Use   Smoking status: Current Every Day Smoker    Types: Cigarettes   Smokeless tobacco: Never Used  Substance Use Topics   Alcohol use: Yes   Drug use: Yes    Types: Marijuana     Allergies   Patient has no known allergies.   Review of Systems Review of Systems  Constitutional: Negative for fever.  Respiratory: Negative for cough and shortness of breath.   Cardiovascular: Negative for chest pain.  Gastrointestinal: Positive for abdominal pain, diarrhea, nausea and vomiting.  Genitourinary: Negative for dysuria and hematuria.  Neurological: Positive for light-headedness. Negative for headaches.  All other systems reviewed and are negative.    Physical Exam Updated Vital Signs BP 124/83 (BP Location: Left Arm)  Pulse 64    Temp 98.4 F (36.9 C) (Oral)    Resp 19    LMP 09/27/2018 Comment: negative HCG blood test 09-29-2018   SpO2 100%   Physical Exam Vitals signs and nursing note reviewed.  Constitutional:      Appearance: Normal appearance. She is well-developed.  HENT:     Head: Normocephalic and atraumatic.  Eyes:     General: Lids are normal.     Conjunctiva/sclera: Conjunctivae normal.     Pupils: Pupils are equal, round, and reactive to light.  Neck:     Musculoskeletal: Full passive range of motion  without pain.  Cardiovascular:     Rate and Rhythm: Normal rate and regular rhythm.     Pulses: Normal pulses.     Heart sounds: Normal heart sounds. No murmur. No friction rub. No gallop.   Pulmonary:     Effort: Pulmonary effort is normal.     Breath sounds: Normal breath sounds.     Comments: Lungs clear to auscultation bilaterally.  Symmetric chest rise.  No wheezing, rales, rhonchi. Abdominal:     Palpations: Abdomen is soft. Abdomen is not rigid.     Tenderness: There is generalized abdominal tenderness. There is no right CVA tenderness, left CVA tenderness or guarding.     Comments: Abdomen is soft, non-distended. Generalized tenderness with no focal point noted. No CVA tenderness noted bilaterally.   Musculoskeletal: Normal range of motion.  Skin:    General: Skin is warm and dry.     Capillary Refill: Capillary refill takes less than 2 seconds.  Neurological:     Mental Status: She is alert and oriented to person, place, and time.  Psychiatric:        Speech: Speech normal.      ED Treatments / Results  Labs (all labs ordered are listed, but only abnormal results are displayed) Labs Reviewed  COMPREHENSIVE METABOLIC PANEL - Abnormal; Notable for the following components:      Result Value   Glucose, Bld 110 (*)    AST 12 (*)    All other components within normal limits  LIPASE, BLOOD - Abnormal; Notable for the following components:   Lipase 61 (*)    All other components within normal limits  URINALYSIS, ROUTINE W REFLEX MICROSCOPIC - Abnormal; Notable for the following components:   Color, Urine STRAW (*)    Specific Gravity, Urine 1.002 (*)    All other components within normal limits  CBC WITH DIFFERENTIAL/PLATELET  I-STAT BETA HCG BLOOD, ED ([facility], [facility], [facility] ONLY)    EKG None  Radiology Ct Head Wo Contrast  Result Date: 09/29/2018 CLINICAL DATA:  Severe headache. Vomiting. EXAM: CT HEAD WITHOUT CONTRAST TECHNIQUE: Contiguous axial images were obtained from  the base of the skull through the vertex without intravenous contrast. COMPARISON:  None. FINDINGS: Brain: No evidence of acute infarction, hemorrhage, hydrocephalus, extra-axial collection, or mass lesion/mass effect. Vascular:  No hyperdense vessel or other acute findings. Skull: No evidence of fracture or other significant bone abnormality. Sinuses/Orbits:  No acute findings. Other: None. IMPRESSION: Negative noncontrast head CT. Electronically Signed   By: Danae OrleansJohn A Stahl M.D.   On: 09/29/2018 12:47   Ct Abdomen Pelvis W Contrast  Result Date: 09/29/2018 CLINICAL DATA:  Abdominal pain with emesis EXAM: CT ABDOMEN AND PELVIS WITH CONTRAST TECHNIQUE: Multidetector CT imaging of the abdomen and pelvis was performed using the standard protocol following bolus administration of intravenous contrast. Oral contrast was also administered. CONTRAST:  30mL OMNIPAQUE IOHEXOL 300 MG/ML SOLN, 100mL OMNIPAQUE IOHEXOL 300 MG/ML SOLN COMPARISON:  None. FINDINGS: Lower chest: Lung bases are clear. Hepatobiliary: No focal liver lesions are evident. The gallbladder wall is not appreciably thickened. There is no appreciable biliary duct dilatation. Pancreas: There is no pancreatic mass or inflammatory focus. Spleen: No splenic lesions are evident. Adrenals/Urinary Tract: Adrenals bilaterally appear unremarkable. Kidneys bilaterally show no evident mass or hydronephrosis on either side. There is no evident renal or ureteral calculus on either side. Urinary bladder is midline with wall thickness within normal limits. Stomach/Bowel: There is no appreciable bowel wall or mesenteric thickening. No evident bowel obstruction. The terminal ileum appears unremarkable. There is no evident free air or portal venous air. Vascular/Lymphatic: There is no abdominal aortic aneurysm. No vascular lesions are evident. No adenopathy is appreciable in the abdomen or pelvis. Reproductive: Uterus is anteverted. There is an apparent collapsed cyst in the  right ovary measuring 1.5 x 1.5 cm. No other pelvic mass evident. There are follicles in the ovaries, a physiologic finding. A small amount of fluid is noted in the cul-de-sac. Other: Appendix appears normal. There is no evident abscess in the abdomen or pelvis. There is no ascites beyond the mild fluid in the cul-de-sac region. Musculoskeletal: There are no blastic or lytic bone lesions. There is no intramuscular or abdominal wall lesion. IMPRESSION: 1. Small amount of fluid in the cul-de-sac. Suspect recent ovarian cyst rupture. 2. No bowel obstruction. No abscess in the abdomen or pelvis. Appendix region appears normal. 3. No evident renal or ureteral calculus. No hydronephrosis on either side. Urinary bladder wall thickness normal. 2. Electronically Signed   By: Bretta BangWilliam  Woodruff III M.D.   On: 09/29/2018 12:55    Procedures Procedures (including critical care time)  Medications Ordered in ED Medications  sodium chloride (PF) 0.9 % injection (has no administration in time range)  sodium chloride 0.9 % bolus 1,000 mL (0 mLs Intravenous Stopped 09/29/18 1003)  ondansetron (ZOFRAN) injection 4 mg (4 mg Intravenous Given 09/29/18 0753)  morphine 4 MG/ML injection 4 mg (4 mg Intravenous Given 09/29/18 0753)  sodium chloride 0.9 % bolus 1,000 mL (0 mLs Intravenous Stopped 09/29/18 1401)  ketorolac (TORADOL) 30 MG/ML injection 30 mg (30 mg Intravenous Given 09/29/18 1001)  promethazine (PHENERGAN) injection 25 mg (25 mg Intravenous Given 09/29/18 1002)  LORazepam (ATIVAN) injection 1 mg (1 mg Intravenous Given 09/29/18 0959)  iohexol (OMNIPAQUE) 300 MG/ML solution 30 mL (30 mLs Oral Contrast Given 09/29/18 1015)  iohexol (OMNIPAQUE) 300 MG/ML solution 100 mL (100 mLs Intravenous Contrast Given 09/29/18 1205)     Initial Impression / Assessment and Plan / ED Course  I have reviewed the triage vital signs and the nursing notes.  Pertinent labs & imaging results that were available during my care of the  patient were reviewed by me and considered in my medical decision making (see chart for details).        23 year old female who presents for evaluation of generalized abdominal pain, nausea/vomiting and diarrhea for the last 4 days.  She reports that symptoms began after she ate oysters [place] a beach.  She was seen by telehealth and given Zofran which she states did not have any improvement.  Today with continued pain.  Did not measure fever but felt chills yesterday. Patient is afebrile, non-toxic appearing, sitting comfortably on examination table. Vital signs reviewed and stable.  On exam, she is generalized tenderness.  Otherwise unremarkable.  Plan to check labs and  reassess.  CMP shows BUN and creatinine within normal limits.  Otherwise unremarkable.  CBC without any significant leukocytosis or anemia.  I-STAT beta negative.  UA does not show any infectious etiology.  Lipase is slightly elevated at 61.  Re-evaluation. Patient reports she is still feeling very nauseous and reports that the initial medication did not help her pain. She states her pain is more localized now to her LUQ. Additionally she seems to be hyperventilating and very anxious. Repeat abdominal exam shows tenderness noted to the LUQ. Will give additionally analgesics. Additionally, I feel she may be having some anxiety related to her symptoms.   RN reported to me that patient was having "episodes."  Patient states she felt lightheaded and felt very nauseous.  She was hyperventilating at time.  She appeared very anxious.  She felt like she had to lay down and felt like she was going to vomit.  Patient states that this is happened before and she has been evaluated for in the ED previously.  We will plan for EKG.  Reviewed her work-up.  She has never had a head CT for evaluation of the symptoms.  Will plan for head CT to ensure that there is no intracranial mass.  CT abd pelvis reviewed.  No evidence of infectious etiology.  There is  a recent ruptured right ovarian cyst small in fluid.  No evidence of obstruction.  No abscess.  Appendix is normal.  No other acute abnormalities.  CT head negative for any acute intracranial abnormality.  Reevaluation.  Patient is resting comfortably in bed with no signs of distress.  She is sleeping comfortably.  She reports that her pain has improved.  She states that she has been able to tolerate p.o.  Reevaluation of her abdomen shows improved tenderness.  I personally ambulated patient in the ED with no signs of gait ataxia.  She was alert and oriented and able to answer questions appropriately.  I discussed with her that the lightheadedness might be due to the vomiting.  She had previous work-up here in the ED several weeks ago for same thing which was unable to find the source of her symptoms.  At this time, do not feel that this is a CVA.  Do not feel that patient needs further imaging.  Plan to give outpatient referral to [facility] to establish primary care doctor. At this time, patient exhibits no emergent life-threatening condition that require further evaluation in ED or admission. Patient had ample opportunity for questions and discussion. All patient's questions were answered with full understanding. Strict return precautions discussed. Patient expresses understanding and agreement to plan.   Portions of this note were generated with Scientist, clinical (histocompatibility and immunogenetics)Dragon dictation software. Dictation errors may occur despite best attempts at proofreading.   Final Clinical Impressions(s) / ED Diagnoses   Final diagnoses:  Generalized abdominal pain  Non-intractable vomiting with nausea, unspecified vomiting type    ED Discharge Orders         Ordered    ondansetron (ZOFRAN ODT) 4 MG disintegrating tablet  Every 8 hours PRN     09/29/18 1340    dicyclomine (BENTYL) 20 MG tablet  2 times daily     09/29/18 1340           Maxwell CaulLayden, Akshar Starnes A, PA-C 09/29/18 1432    Virgina Norfolkuratolo, Adam, DO 09/29/18  1512

## 2018-09-29 NOTE — Discharge Instructions (Signed)
Take Bentyl for pain and Zofran for nausea.  As we discussed, make sure you are drinking fluids and stay hydrated.  [facility].  Return to the Emergency Department immediately if you experience any worsening abdominal pain, fever, persistent nausea and vomiting, inability keep any food down, pain with urination, blood in your urine or any other worsening or concerning symptoms.

## 2018-09-29 NOTE — ED Notes (Signed)
Patient reports she is having one of her "episodes" while this RN is in the room. Pt reports her arms and legs are tingling, she feels like she wants to throw up, pass out, and go to sleep all at the same time. RN gave verbal support and encouraged patient to breathe. Pt was able to feel RN touching her feet and hands.  Pt stated, during the episode, "I need to calm down" PA made aware of event.

## 2018-09-29 NOTE — ED Triage Notes (Signed)
Pt c/o abd pain x 4 days and just "feeling really bad". Pt did virtual visit and was given rx for zofran for possible food poisoning on Monday.  Pt was in [place] this weekend and ate oysters.  Pt states about 3 episodes of emesis, along with dry heaves. Pt endorses diarrhea as well

## 2018-11-06 ENCOUNTER — Emergency Department (HOSPITAL_COMMUNITY)
Admission: EM | Admit: 2018-11-06 | Discharge: 2018-11-06 | Disposition: A | Discharge status: Home / Self Care | Payer: Self-pay | Attending: Emergency Medicine | Admitting: Emergency Medicine

## 2018-11-06 ENCOUNTER — Other Ambulatory Visit: Payer: Self-pay

## 2018-11-06 ENCOUNTER — Encounter (HOSPITAL_COMMUNITY): Payer: Self-pay

## 2018-11-06 DIAGNOSIS — T887XXA Unspecified adverse effect of drug or medicament, initial encounter: Secondary | ICD-10-CM | POA: Insufficient documentation

## 2018-11-06 DIAGNOSIS — T50905A Adverse effect of unspecified drugs, medicaments and biological substances, initial encounter: Secondary | ICD-10-CM

## 2018-11-06 DIAGNOSIS — T43205A Adverse effect of unspecified antidepressants, initial encounter: Secondary | ICD-10-CM | POA: Insufficient documentation

## 2018-11-06 DIAGNOSIS — F17210 Nicotine dependence, cigarettes, uncomplicated: Secondary | ICD-10-CM | POA: Insufficient documentation

## 2018-11-06 DIAGNOSIS — Z79899 Other long term (current) drug therapy: Secondary | ICD-10-CM | POA: Insufficient documentation

## 2018-11-06 DIAGNOSIS — R1084 Generalized abdominal pain: Secondary | ICD-10-CM | POA: Insufficient documentation

## 2018-11-06 DIAGNOSIS — Y69 Unspecified misadventure during surgical and medical care: Secondary | ICD-10-CM | POA: Insufficient documentation

## 2018-11-06 DIAGNOSIS — R110 Nausea: Secondary | ICD-10-CM | POA: Insufficient documentation

## 2018-11-06 LAB — URINALYSIS, ROUTINE W REFLEX MICROSCOPIC
Bilirubin Urine: NEGATIVE
Glucose, UA: NEGATIVE mg/dL
Hgb urine dipstick: NEGATIVE
Ketones, ur: 5 mg/dL — AB
Nitrite: NEGATIVE
Protein, ur: NEGATIVE mg/dL
Specific Gravity, Urine: 1.006 (ref 1.005–1.030)
pH: 7 (ref 5.0–8.0)

## 2018-11-06 LAB — COMPREHENSIVE METABOLIC PANEL
ALT: 14 U/L (ref 0–44)
AST: 15 U/L (ref 15–41)
Albumin: 4.7 g/dL (ref 3.5–5.0)
Alkaline Phosphatase: 42 U/L (ref 38–126)
Anion gap: 13 (ref 5–15)
BUN: 8 mg/dL (ref 6–20)
CO2: 21 mmol/L — ABNORMAL LOW (ref 22–32)
Calcium: 9.7 mg/dL (ref 8.9–10.3)
Chloride: 105 mmol/L (ref 98–111)
Creatinine, Ser: 0.9 mg/dL (ref 0.44–1.00)
GFR calc Af Amer: >60 mL/min (ref >60)
GFR calc non Af Amer: >60 mL/min (ref >60)
Glucose, Bld: 107 mg/dL — ABNORMAL HIGH (ref 70–99)
Potassium: 3.3 mmol/L — ABNORMAL LOW (ref 3.5–5.1)
Sodium: 139 mmol/L (ref 135–145)
Total Bilirubin: 1 mg/dL (ref 0.3–1.2)
Total Protein: 8.9 g/dL — ABNORMAL HIGH (ref 6.5–8.1)

## 2018-11-06 LAB — LIPASE, BLOOD: Lipase: 36 U/L (ref 11–51)

## 2018-11-06 LAB — CBC
HCT: 44.3 % (ref 36.0–46.0)
Hemoglobin: 14.6 g/dL (ref 12.0–15.0)
MCH: 31.1 pg (ref 26.0–34.0)
MCHC: 33 g/dL (ref 30.0–36.0)
MCV: 94.3 fL (ref 80.0–100.0)
Platelets: 283 10*3/uL (ref 150–400)
RBC: 4.7 MIL/uL (ref 3.87–5.11)
RDW: 13.1 % (ref 11.5–15.5)
WBC: 10.9 10*3/uL — ABNORMAL HIGH (ref 4.0–10.5)
nRBC: 0 % (ref 0.0–0.2)

## 2018-11-06 LAB — I-STAT BETA HCG BLOOD, ED (MC, WL, AP ONLY): I-stat hCG, quantitative: <5 m[IU]/mL (ref <5)

## 2018-11-06 MED ORDER — HALOPERIDOL LACTATE 5 MG/ML IJ SOLN
2.0000 mg | Freq: Once | INTRAMUSCULAR | Status: AC
  Administered 2018-11-06: 08:00:00 2 mg via INTRAVENOUS
  Filled 2018-11-06: qty 1

## 2018-11-06 MED ORDER — SODIUM CHLORIDE 0.9 % IV BOLUS
1000.0000 mL | Freq: Once | INTRAVENOUS | Status: AC
  Administered 2018-11-06: 1000 mL via INTRAVENOUS

## 2018-11-06 MED ORDER — ONDANSETRON HCL 4 MG PO TABS: 4.0000 mg | ORAL_TABLET | Freq: Four times a day (QID) | ORAL | 0 refills | Status: DC

## 2018-11-06 MED ORDER — SODIUM CHLORIDE 0.9% FLUSH: 3.0000 mL | Freq: Once | INTRAVENOUS | Status: DC

## 2018-11-06 NOTE — ED Triage Notes (Signed)
Pt thinks that she is having an allergic reaction to some recent prescriptions, including fluoxetine, oxcarbazepine, and propranalol. States that she has been vomiting, shaking, and abdominal pain since.

## 2018-11-06 NOTE — ED Notes (Signed)
Urine culture sent to the lab. 

## 2018-11-06 NOTE — ED Notes (Signed)
ED Provider at bedside. 

## 2018-11-12 NOTE — ED Provider Notes (Signed)
[facility]-EMERGENCY DEPT Provider Note   CSN: [id number] Arrival date & time: 11/06/18  0554     History   Chief Complaint Chief Complaint  Patient presents with  . Medication Reaction  . Emesis    HPI Kristi Harmon is a 23 y.o. female.     HPI   [age] old female with nausea vomiting, crampy abdominal pain and shaking.  Patient was recently started on multiple medications including fluoxetine, oxcarbazepine, propranolol and trazodone.  She feels like she may be having a medication reaction.  Does admit to feeling anxious but thinks that these medications may be playing a significant role.  Does follow with a counselor.  Recent seeing a psychiatrist that prescribed medications.  No urinary complaints.  Does not think she is pregnant.  No diarrhea.  Past Medical History:  Diagnosis Date  . Anemia   . Pancreatitis     There are no active problems to display for this patient.   Past Surgical History:  Procedure Laterality Date  . WISDOM TOOTH EXTRACTION       OB History   No obstetric history on file.      Home Medications    Prior to Admission medications   Medication Sig Start Date End Date Taking? Authorizing Provider  MICROGESTIN FE 1/20 1-20 MG-MCG tablet Take 1 tablet by mouth daily. 10/01/18  Yes [provider]  dicyclomine (BENTYL) 20 MG tablet Take 1 tablet (20 mg total) by mouth 2 (two) times daily. Patient not taking: Reported on 11/06/2018 09/29/18   Maxwell CaulLayden, Lindsey A, PA-C  FLUoxetine (PROZAC) 20 MG capsule Take 20 mg by mouth daily. 11/04/18   [provider]  ondansetron (ZOFRAN ODT) 4 MG disintegrating tablet Take 1 tablet (4 mg total) by mouth every 8 (eight) hours as needed for nausea or vomiting. Patient not taking: Reported on 11/06/2018 09/29/18   Graciella FreerLayden, Lindsey A, PA-C  ondansetron (ZOFRAN) 4 MG tablet Take 1 tablet (4 mg total) by mouth every 6 (six) hours. 11/06/18   Raeford RazorKohut, Pamelyn Bancroft, MD  OXcarbazepine  (TRILEPTAL) 150 MG tablet Take 150 mg by mouth 2 (two) times daily. 11/04/18   [provider]  propranolol (INDERAL) 10 MG tablet Take 10 mg by mouth 3 (three) times daily as needed. 11/04/18   [provider]  traZODone (DESYREL) 50 MG tablet Take 50 mg by mouth at bedtime. 11/04/18   [provider]  promethazine (PHENERGAN) 25 MG tablet Take 1 tablet (25 mg total) by mouth every 6 (six) hours as needed for nausea or vomiting. Patient not taking: Reported on 05/12/2017 02/25/16 09/12/18  Ward, Layla MawKristen N, DO    Family History Family History  Problem Relation Age of Onset  . Alcohol abuse Mother   . Cancer Paternal Grandmother     Social History Social History   Tobacco Use  . Smoking status: Current Every Day Smoker    Types: Cigarettes  . Smokeless tobacco: Never Used  Substance Use Topics  . Alcohol use: Yes  . Drug use: Yes    Types: Marijuana     Allergies   Patient has no known allergies.   Review of Systems Review of Systems  All systems reviewed and negative, other than as noted in HPI.  Physical Exam Updated Vital Signs BP 111/79   Pulse 70   Temp 98.7 F (37.1 C) (Oral)   Resp 16   SpO2 98%   Physical Exam Vitals signs and nursing note reviewed.  Constitutional:  General: She is not in acute distress.    Appearance: She is well-developed.  HENT:     Head: Normocephalic and atraumatic.  Eyes:     General:        Right eye: No discharge.        Left eye: No discharge.     Conjunctiva/sclera: Conjunctivae normal.  Neck:     Musculoskeletal: Neck supple.  Cardiovascular:     Rate and Rhythm: Normal rate and regular rhythm.     Heart sounds: Normal heart sounds. No murmur. No friction rub. No gallop.   Pulmonary:     Effort: Pulmonary effort is normal. No respiratory distress.     Breath sounds: Normal breath sounds.  Abdominal:     General: There is no distension.     Palpations: Abdomen is soft.     Tenderness:  There is no abdominal tenderness.  Musculoskeletal:        General: No tenderness.  Skin:    General: Skin is warm and dry.  Neurological:     Mental Status: She is alert.  Psychiatric:        Behavior: Behavior normal.        Thought Content: Thought content normal.      ED Treatments / Results  Labs (all labs ordered are listed, but only abnormal results are displayed) Labs Reviewed  COMPREHENSIVE METABOLIC PANEL - Abnormal; Notable for the following components:      Result Value   Potassium 3.3 (*)    CO2 21 (*)    Glucose, Bld 107 (*)    Total Protein 8.9 (*)    All other components within normal limits  CBC - Abnormal; Notable for the following components:   WBC 10.9 (*)    All other components within normal limits  URINALYSIS, ROUTINE W REFLEX MICROSCOPIC - Abnormal; Notable for the following components:   APPearance CLOUDY (*)    Ketones, ur 5 (*)    Leukocytes,Ua MODERATE (*)    Bacteria, UA FEW (*)    All other components within normal limits  LIPASE, BLOOD  I-STAT BETA HCG BLOOD, ED ([facility], [facility], [facility] ONLY)    EKG None  Radiology No results found.  Procedures Procedures (including critical care time)  Medications Ordered in ED Medications  sodium chloride 0.9 % bolus 1,000 mL (0 mLs Intravenous Stopped 11/06/18 0929)  haloperidol lactate (HALDOL) injection 2 mg (2 mg Intravenous Given 11/06/18 0759)     Initial Impression / Assessment and Plan / ED Course  I have reviewed the triage vital signs and the nursing notes.  Pertinent labs & imaging results that were available during my care of the patient were reviewed by me and considered in my medical decision making (see chart for details).        23 year old female with nausea.  Abdominal exam is pretty benign.  There may be some degree of underlying anxiety.  She was discharged on multiple medications 2 days ago though.  They may potentially be contributing to her symptoms.  At this point I think she is  medically safe to stop them since she has not been on them for any extended period of time.  Advised that she follow back up with prescribing provider.  It may be more prudent to initiate less medications initially to monitor response to treatment.  She is feeling better after symptomatic treatment here in the emergency room.  Her ED work-up is pretty unremarkable.  Her tachycardia has resolved.  Reassured.  Return precautions discussed.  Outpatient follow-up otherwise.  Final Clinical Impressions(s) / ED Diagnoses   Final diagnoses:  Adverse effect of drug, initial encounter  Nausea    ED Discharge Orders         Ordered    ondansetron (ZOFRAN) 4 MG tablet  Every 6 hours     11/06/18 0951           Virgel Manifold, MD 11/12/18 1115

## 2019-01-17 ENCOUNTER — Emergency Department (HOSPITAL_COMMUNITY)
Admission: EM | Admit: 2019-01-17 | Discharge: 2019-01-17 | Disposition: A | Discharge status: Home / Self Care | Payer: Self-pay | Attending: Emergency Medicine | Admitting: Emergency Medicine

## 2019-01-17 ENCOUNTER — Encounter (HOSPITAL_COMMUNITY): Payer: Self-pay | Admitting: *Deleted

## 2019-01-17 ENCOUNTER — Other Ambulatory Visit: Payer: Self-pay

## 2019-01-17 ENCOUNTER — Emergency Department (HOSPITAL_COMMUNITY): Payer: Self-pay

## 2019-01-17 DIAGNOSIS — Z79899 Other long term (current) drug therapy: Secondary | ICD-10-CM | POA: Insufficient documentation

## 2019-01-17 DIAGNOSIS — R1010 Upper abdominal pain, unspecified: Secondary | ICD-10-CM

## 2019-01-17 DIAGNOSIS — Z87891 Personal history of nicotine dependence: Secondary | ICD-10-CM | POA: Insufficient documentation

## 2019-01-17 LAB — COMPREHENSIVE METABOLIC PANEL
ALT: 7 U/L (ref 0–44)
AST: 11 U/L — ABNORMAL LOW (ref 15–41)
Albumin: 4.3 g/dL (ref 3.5–5.0)
Alkaline Phosphatase: 36 U/L — ABNORMAL LOW (ref 38–126)
Anion gap: 10 (ref 5–15)
BUN: 11 mg/dL (ref 6–20)
CO2: 22 mmol/L (ref 22–32)
Calcium: 8.8 mg/dL — ABNORMAL LOW (ref 8.9–10.3)
Chloride: 105 mmol/L (ref 98–111)
Creatinine, Ser: 0.71 mg/dL (ref 0.44–1.00)
GFR calc Af Amer: >60 mL/min (ref >60)
GFR calc non Af Amer: >60 mL/min (ref >60)
Glucose, Bld: 107 mg/dL — ABNORMAL HIGH (ref 70–99)
Potassium: 3.8 mmol/L (ref 3.5–5.1)
Sodium: 137 mmol/L (ref 135–145)
Total Bilirubin: 0.8 mg/dL (ref 0.3–1.2)
Total Protein: 7.5 g/dL (ref 6.5–8.1)

## 2019-01-17 LAB — URINALYSIS, ROUTINE W REFLEX MICROSCOPIC
Bilirubin Urine: NEGATIVE
Glucose, UA: NEGATIVE mg/dL
Hgb urine dipstick: NEGATIVE
Ketones, ur: 5 mg/dL — AB
Leukocytes,Ua: NEGATIVE
Nitrite: NEGATIVE
Protein, ur: NEGATIVE mg/dL
Specific Gravity, Urine: 1.013 (ref 1.005–1.030)
pH: 6 (ref 5.0–8.0)

## 2019-01-17 LAB — CBC WITH DIFFERENTIAL/PLATELET
Abs Immature Granulocytes: 0.01 10*3/uL (ref 0.00–0.07)
Basophils Absolute: 0 10*3/uL (ref 0.0–0.1)
Basophils Relative: 0 %
Eosinophils Absolute: 0.1 10*3/uL (ref 0.0–0.5)
Eosinophils Relative: 1 %
HCT: 40.8 % (ref 36.0–46.0)
Hemoglobin: 13.3 g/dL (ref 12.0–15.0)
Immature Granulocytes: 0 %
Lymphocytes Relative: 28 %
Lymphs Abs: 2.1 10*3/uL (ref 0.7–4.0)
MCH: 30.9 pg (ref 26.0–34.0)
MCHC: 32.6 g/dL (ref 30.0–36.0)
MCV: 94.7 fL (ref 80.0–100.0)
Monocytes Absolute: 0.5 10*3/uL (ref 0.1–1.0)
Monocytes Relative: 7 %
Neutro Abs: 4.7 10*3/uL (ref 1.7–7.7)
Neutrophils Relative %: 64 %
Platelets: 224 10*3/uL (ref 150–400)
RBC: 4.31 MIL/uL (ref 3.87–5.11)
RDW: 13 % (ref 11.5–15.5)
WBC: 7.4 10*3/uL (ref 4.0–10.5)
nRBC: 0 % (ref 0.0–0.2)

## 2019-01-17 LAB — LIPASE, BLOOD: Lipase: 28 U/L (ref 11–51)

## 2019-01-17 LAB — WET PREP, GENITAL
Sperm: NONE SEEN
Trich, Wet Prep: NONE SEEN
Yeast Wet Prep HPF POC: NONE SEEN

## 2019-01-17 MED ORDER — MORPHINE SULFATE (PF) 4 MG/ML IV SOLN
4.0000 mg | Freq: Once | INTRAVENOUS | Status: AC
  Administered 2019-01-17: 4 mg via INTRAVENOUS
  Filled 2019-01-17: qty 1

## 2019-01-17 MED ORDER — ONDANSETRON HCL 4 MG/2ML IJ SOLN
4.0000 mg | Freq: Once | INTRAMUSCULAR | Status: AC
  Administered 2019-01-17: 4 mg via INTRAVENOUS
  Filled 2019-01-17: qty 2

## 2019-01-17 MED ORDER — KETOROLAC TROMETHAMINE 15 MG/ML IJ SOLN
15.0000 mg | Freq: Once | INTRAMUSCULAR | Status: AC
  Administered 2019-01-17: 12:00:00 15 mg via INTRAVENOUS
  Filled 2019-01-17: qty 1

## 2019-01-17 MED ORDER — SODIUM CHLORIDE 0.9 % IV BOLUS
1000.0000 mL | Freq: Once | INTRAVENOUS | Status: AC
  Administered 2019-01-17: 1000 mL via INTRAVENOUS

## 2019-01-17 MED ORDER — SODIUM CHLORIDE (PF) 0.9 % IJ SOLN
INTRAMUSCULAR | Status: AC
  Filled 2019-01-17: qty 50

## 2019-01-17 MED ORDER — MORPHINE SULFATE (PF) 4 MG/ML IV SOLN
4.0000 mg | Freq: Once | INTRAVENOUS | Status: AC
  Administered 2019-01-17: 10:00:00 4 mg via INTRAVENOUS
  Filled 2019-01-17: qty 1

## 2019-01-17 MED ORDER — IOHEXOL 300 MG/ML  SOLN
100.0000 mL | Freq: Once | INTRAMUSCULAR | Status: AC | PRN
  Administered 2019-01-17: 100 mL via INTRAVENOUS

## 2019-01-17 MED ORDER — IOHEXOL 300 MG/ML  SOLN
30.0000 mL | Freq: Once | INTRAMUSCULAR | Status: AC | PRN
  Administered 2019-01-17: 30 mL via ORAL

## 2019-01-17 MED ORDER — DICYCLOMINE HCL 20 MG PO TABS: ORAL_TABLET | ORAL | 0 refills | Status: DC

## 2019-01-17 NOTE — ED Provider Notes (Signed)
[facility]-EMERGENCY DEPT Provider Note   CSN: [id number] Arrival date & time: 01/17/19  4098     History   Chief Complaint Chief Complaint  Patient presents with   Rectal Bleeding    HPI Kristi Harmon is a 23 y.o. female.     Patient is a 23 year old female with a history of pancreatitis, anemia who is presenting today with abdominal pain.  Patient states the pain started slowly yesterday evening and has gradually worsened throughout the night.  It is in the right side of her abdomen and now radiates up into her right flank area and down into her right groin.  She states it is a constant pain but then will occasionally get a very sharp cramp.  It also makes her feel bloated.  This morning she also noted a episode of rectal bleeding.  She described as dark clot with no stool.  She denies any vaginal bleeding or urinary symptoms.  She has had no vaginal discharge and last menses was 2 weeks ago.  She denies any recent alcohol, tobacco use but does smoke marijuana daily.  She states the rectal bleeding has been going on for a while but usually only has 1-2 episodes and it will resolve for weeks to months and then return.  Is never been associated with pain.  She does have multiple family members including siblings and her grandmother who have had issues with colon cancer and polyps.  She has never herself seen a gastroenterologist.  The pain makes her nauseated but she denies any vomiting, fever and has never had abdominal surgery.  The history is provided by the patient.  Rectal Bleeding Quality: large dark clot today when having a bowel movment.  all blood and no stool. Amount:  Moderate Duration: this has been happening for months but usually only 1-2 episodes and resolves for weeks.  never associated with abd pain. Context: not diarrhea and not rectal pain   Similar prior episodes: yes   Relieved by:  None tried Worsened by:  Defecation Ineffective treatments:   None tried Associated symptoms: abdominal pain   Associated symptoms: no fever, no loss of consciousness and no vomiting   Risk factors comment:  Family hx of multiple siblings having colon problems and grandmother with colon cancer   Past Medical History:  Diagnosis Date   Anemia    Pancreatitis     There are no active problems to display for this patient.   Past Surgical History:  Procedure Laterality Date   WISDOM TOOTH EXTRACTION       OB History   No obstetric history on file.      Home Medications    Prior to Admission medications   Medication Sig Start Date End Date Taking? Authorizing Provider  dicyclomine (BENTYL) 20 MG tablet Take 1 tablet (20 mg total) by mouth 2 (two) times daily. Patient not taking: Reported on 11/06/2018 09/29/18   Maxwell Caul, PA-C  FLUoxetine (PROZAC) 20 MG capsule Take 20 mg by mouth daily. 11/04/18   [provider]  MICROGESTIN FE 1/20 1-20 MG-MCG tablet Take 1 tablet by mouth daily. 10/01/18   [provider]  ondansetron (ZOFRAN ODT) 4 MG disintegrating tablet Take 1 tablet (4 mg total) by mouth every 8 (eight) hours as needed for nausea or vomiting. Patient not taking: Reported on 11/06/2018 09/29/18   Graciella Freer A, PA-C  ondansetron (ZOFRAN) 4 MG tablet Take 1 tablet (4 mg total) by mouth every 6 (  six) hours. 11/06/18   Raeford Razor, MD  OXcarbazepine (TRILEPTAL) 150 MG tablet Take 150 mg by mouth 2 (two) times daily. 11/04/18   [provider]  propranolol (INDERAL) 10 MG tablet Take 10 mg by mouth 3 (three) times daily as needed. 11/04/18   [provider]  traZODone (DESYREL) 50 MG tablet Take 50 mg by mouth at bedtime. 11/04/18   [provider]  promethazine (PHENERGAN) 25 MG tablet Take 1 tablet (25 mg total) by mouth every 6 (six) hours as needed for nausea or vomiting. Patient not taking: Reported on 05/12/2017 02/25/16 09/12/18  Ward, Layla Maw, DO    Family History Family  History  Problem Relation Age of Onset   Alcohol abuse Mother    Cancer Paternal Grandmother     Social History Social History   Tobacco Use   Smoking status: Former Smoker    Types: Cigarettes   Smokeless tobacco: Never Used  Substance Use Topics   Alcohol use: Yes   Drug use: Yes    Types: Marijuana     Allergies   Patient has no known allergies.   Review of Systems Review of Systems  Constitutional: Negative for fever.  Gastrointestinal: Positive for abdominal pain and hematochezia. Negative for vomiting.  Neurological: Negative for loss of consciousness.  All other systems reviewed and are negative.    Physical Exam Updated Vital Signs BP (!) 121/95 (BP Location: Left Arm)    Pulse 89    Temp 98.2 F (36.8 C) (Oral)    Resp 20    Ht 5\' 2"  (1.575 m)    Wt 49.9 kg    SpO2 100%    BMI 20.12 kg/m   Physical Exam Vitals signs and nursing note reviewed.  Constitutional:      General: She is in acute distress.     Appearance: She is well-developed and normal weight.     Comments: Appears uncomfortable  HENT:     Head: Normocephalic and atraumatic.  Eyes:     Pupils: Pupils are equal, round, and reactive to light.  Cardiovascular:     Rate and Rhythm: Normal rate and regular rhythm.     Pulses: Normal pulses.     Heart sounds: Normal heart sounds. No murmur. No friction rub.  Pulmonary:     Effort: Pulmonary effort is normal.     Breath sounds: Normal breath sounds. No wheezing or rales.  Abdominal:     General: Bowel sounds are normal. There is no distension.     Palpations: Abdomen is soft.     Tenderness: There is abdominal tenderness in the right upper quadrant and right lower quadrant. There is right CVA tenderness and guarding. There is no rebound.    Genitourinary:    Cervix: Normal.     Uterus: Normal.      Adnexa: Left adnexa normal.       Right: Tenderness present.        Left: No mass, tenderness or fullness.    Musculoskeletal: Normal  range of motion.        General: No tenderness.     Comments: No edema  Skin:    General: Skin is warm and dry.     Findings: No rash.  Neurological:     General: No focal deficit present.     Mental Status: She is alert and oriented to person, place, and time. Mental status is at baseline.     Cranial Nerves: No cranial nerve deficit.  Psychiatric:        Mood and Affect: Mood normal.        Behavior: Behavior normal.        Thought Content: Thought content normal.      ED Treatments / Results  Labs (all labs ordered are listed, but only abnormal results are displayed) Labs Reviewed  WET PREP, GENITAL - Abnormal; Notable for the following components:      Result Value   Clue Cells Wet Prep HPF POC PRESENT (*)    WBC, Wet Prep HPF POC MANY (*)    All other components within normal limits  COMPREHENSIVE METABOLIC PANEL - Abnormal; Notable for the following components:   Glucose, Bld 107 (*)    Calcium 8.8 (*)    AST 11 (*)    Alkaline Phosphatase 36 (*)    All other components within normal limits  URINALYSIS, ROUTINE W REFLEX MICROSCOPIC - Abnormal; Notable for the following components:   Ketones, ur 5 (*)    All other components within normal limits  CBC WITH DIFFERENTIAL/PLATELET  LIPASE, BLOOD  GC/CHLAMYDIA PROBE AMP ([place]) NOT AT [facility]    EKG None  Radiology US Transvaginal Non-ob  Result Date: 01/17/2019 CLINICAL DATA:  Right-sided pelvic pain for 1 day. EXAM: TRANSABDOMINAL AND TRANSVAGINAL ULTRASOUND OF PELVIS DOPPLER ULTRASOUND OF OVARIES TECHNIQUE: Both transabdominal and transvaginal ultrasound examinations of the pelvis were performed. Transabdominal technique was performed for global imaging of the pelvis including uterus, ovaries, adnexal regions, and pelvic cul-de-sac. It was necessary to proceed with endovaginal exam following the transabdominal exam to visualize the ovaries and endometrium. Color and duplex Doppler ultrasound was utilized to  evaluate blood flow to the ovaries. COMPARISON:  CT scan 09/29/2018 FINDINGS: Uterus Measurements: 7.5 x 3.4 x 4.7 cm = volume: 62.2 mL. No myometrial abnormalities are identified. Small nabothian cyst noted at the cervix. Endometrium Thickness: 2.4 mm.  No focal abnormality visualized. Right ovary Measurements: 3.5 x 1.7 x 3.0 cm = volume: 9.1 mL. Normal appearance/no adnexal mass. Left ovary Measurements: 2.2 x 1.1 x 3.0 cm = volume: 3.7 mL. Normal appearance/no adnexal mass. Pulsed Doppler evaluation of both ovaries demonstrates normal low-resistance arterial and venous waveforms. Other findings Trace free pelvic fluid in the right lower quadrant near the liver edge. There is also a small focal area of fluid in the right lower quadrant/right pelvis superior to the right ovary which measures 2.2 x 2.1 x 1.8 cm. This could be a benign peritoneal cyst or paraovarian cyst. If symptoms persist CT may be helpful for further evaluation. IMPRESSION: 1. Normal sonographic appearance of the uterus and both ovaries. 2. Small amount of free pelvic fluid. 3. Small focal fluid collection or cystic structure in the right pelvis superior to the right ovary could be a paraovarian cyst or other peritoneal cyst. If symptoms persist CT may be helpful for further evaluation. Electronically Signed   By: Rudie Meyer M.D.   On: 01/17/2019 13:32   US Pelvis Complete  Result Date: 01/17/2019 CLINICAL DATA:  Right-sided pelvic pain for 1 day. EXAM: TRANSABDOMINAL AND TRANSVAGINAL ULTRASOUND OF PELVIS DOPPLER ULTRASOUND OF OVARIES TECHNIQUE: Both transabdominal and transvaginal ultrasound examinations of the pelvis were performed. Transabdominal technique was performed for global imaging of the pelvis including uterus, ovaries, adnexal regions, and pelvic cul-de-sac. It was necessary to proceed with endovaginal exam following the transabdominal exam to visualize the ovaries and endometrium. Color and duplex Doppler ultrasound was  utilized to evaluate blood flow to the  ovaries. COMPARISON:  CT scan 09/29/2018 FINDINGS: Uterus Measurements: 7.5 x 3.4 x 4.7 cm = volume: 62.2 mL. No myometrial abnormalities are identified. Small nabothian cyst noted at the cervix. Endometrium Thickness: 2.4 mm.  No focal abnormality visualized. Right ovary Measurements: 3.5 x 1.7 x 3.0 cm = volume: 9.1 mL. Normal appearance/no adnexal mass. Left ovary Measurements: 2.2 x 1.1 x 3.0 cm = volume: 3.7 mL. Normal appearance/no adnexal mass. Pulsed Doppler evaluation of both ovaries demonstrates normal low-resistance arterial and venous waveforms. Other findings Trace free pelvic fluid in the right lower quadrant near the liver edge. There is also a small focal area of fluid in the right lower quadrant/right pelvis superior to the right ovary which measures 2.2 x 2.1 x 1.8 cm. This could be a benign peritoneal cyst or paraovarian cyst. If symptoms persist CT may be helpful for further evaluation. IMPRESSION: 1. Normal sonographic appearance of the uterus and both ovaries. 2. Small amount of free pelvic fluid. 3. Small focal fluid collection or cystic structure in the right pelvis superior to the right ovary could be a paraovarian cyst or other peritoneal cyst. If symptoms persist CT may be helpful for further evaluation. Electronically Signed   By: Rudie MeyerP.  Gallerani M.D.   On: 01/17/2019 13:32   Ct Abdomen Pelvis W Contrast  Result Date: 01/17/2019 CLINICAL DATA:  Right lower quadrant abdominal pain. Blood in the stools this morning. 2.2 cm focal area of fluid in the right lower quadrant of the abdomen/right pelvis on pelvic ultrasound earlier today. EXAM: CT ABDOMEN AND PELVIS WITH CONTRAST TECHNIQUE: Multidetector CT imaging of the abdomen and pelvis was performed using the standard protocol following bolus administration of intravenous contrast. CONTRAST:  30mL OMNIPAQUE IOHEXOL 300 MG/ML SOLN, 100mL OMNIPAQUE IOHEXOL 300 MG/ML SOLN COMPARISON:  Pelvic ultrasound  obtained earlier today. Abdomen and pelvis CT dated 09/29/2018. Abdomen ultrasound dated 02/25/2016 FINDINGS: Lower chest: Unremarkable. Hepatobiliary: Probable multiple small, noncalcified gallstones in the dependent portion of the gallbladder, measuring up to 4 mm in maximum diameter each. No gallbladder wall thickening or pericholecystic fluid. Mildly diffusely heterogeneous liver with linear areas of decreased density throughout, not previously seen. Pancreas: Unremarkable. No pancreatic ductal dilatation or surrounding inflammatory changes. Spleen: Normal in size without focal abnormality. Adrenals/Urinary Tract: Adrenal glands are unremarkable. Kidneys are normal, without renal calculi, focal lesion, or hydronephrosis. Bladder is unremarkable. Stomach/Bowel: Stomach is within normal limits. Appendix appears normal. No evidence of bowel wall thickening, distention, or inflammatory changes. Vascular/Lymphatic: No significant vascular findings are present. No enlarged abdominal or pelvic lymph nodes. Reproductive: Uterus and bilateral adnexa are unremarkable. Other: Small amount of free peritoneal fluid in the pelvic cul-de-sac, within normal limits of physiological fluid. No separate right pelvic fluid collection. 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 There are moderate degenerative changes at the pseudo articulations. Mild dextroconvex lower lumbar scoliosis. IMPRESSION: 1. Mildly diffusely heterogeneous liver with linear areas of decreased density throughout both lobes, not previously seen. This appearance can be seen with acute hepatitis. This could also represent diffusely unopacified a hepatic veins due to early phase imaging. 2. Probable multiple small, noncalcified gallstones in the dependent portion of the gallbladder without evidence of cholecystitis. 3. Small amount of free peritoneal fluid in the pelvic cul-de-sac, within normal  limits of physiological fluid, with no separate right pelvic fluid collection. 4. 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 Electronically Signed  By: Beckie Salts M.D.   On: 01/17/2019 17:09   [place] Art/ven Flow Abd Pelv Doppler  Result Date: 01/17/2019 CLINICAL DATA:  Right-sided pelvic pain for 1 day. EXAM: TRANSABDOMINAL AND TRANSVAGINAL ULTRASOUND OF PELVIS DOPPLER ULTRASOUND OF OVARIES TECHNIQUE: Both transabdominal and transvaginal ultrasound examinations of the pelvis were performed. Transabdominal technique was performed for global imaging of the pelvis including uterus, ovaries, adnexal regions, and pelvic cul-de-sac. It was necessary to proceed with endovaginal exam following the transabdominal exam to visualize the ovaries and endometrium. Color and duplex Doppler ultrasound was utilized to evaluate blood flow to the ovaries. COMPARISON:  CT scan 09/29/2018 FINDINGS: Uterus Measurements: 7.5 x 3.4 x 4.7 cm = volume: 62.2 mL. No myometrial abnormalities are identified. Small nabothian cyst noted at the cervix. Endometrium Thickness: 2.4 mm.  No focal abnormality visualized. Right ovary Measurements: 3.5 x 1.7 x 3.0 cm = volume: 9.1 mL. Normal appearance/no adnexal mass. Left ovary Measurements: 2.2 x 1.1 x 3.0 cm = volume: 3.7 mL. Normal appearance/no adnexal mass. Pulsed Doppler evaluation of both ovaries demonstrates normal low-resistance arterial and venous waveforms. Other findings Trace free pelvic fluid in the right lower quadrant near the liver edge. There is also a small focal area of fluid in the right lower quadrant/right pelvis superior to the right ovary which measures 2.2 x 2.1 x 1.8 cm. This could be a benign peritoneal cyst or paraovarian cyst. If symptoms persist CT may be helpful for further evaluation. IMPRESSION: 1. Normal sonographic appearance of the uterus and both ovaries. 2. Small  amount of free pelvic fluid. 3. Small focal fluid collection or cystic structure in the right pelvis superior to the right ovary could be a paraovarian cyst or other peritoneal cyst. If symptoms persist CT may be helpful for further evaluation. Electronically Signed   By: Rudie Meyer M.D.   On: 01/17/2019 13:32    Procedures Procedures (including critical care time)  Medications Ordered in ED Medications  sodium chloride 0.9 % bolus 1,000 mL (has no administration in time range)  ondansetron (ZOFRAN) injection 4 mg (has no administration in time range)  morphine 4 MG/ML injection 4 mg (has no administration in time range)     Initial Impression / Assessment and Plan / ED Course  I have reviewed the triage vital signs and the nursing notes.  Pertinent labs & imaging results that were available during my care of the patient were reviewed by me and considered in my medical decision making (see chart for details).       \Patient presenting with gradual worsening abdominal pain that started last night.  The pain is in the right side of the abdomen and radiates into the flank.  She did have rectal bleeding this morning but that has been an ongoing thing and may be unrelated to the pain today.  Patient states she has never had abdominal pain like this however in July she was seen for nausea vomiting diarrhea and abdominal pain and at that time had a CT that showed concern for recent ruptured ovarian cyst.  Patient denies any urinary or vaginal symptoms at this time.  On exam her pain is pronounced in both right upper and lower quadrants as well as some CVA tenderness.  Concern for ovarian pathology torsion versus ruptured cyst versus kidney stone, hepatitis or potential cholelithiasis.  Symptoms were fairly abrupt and lower suspicion for appendicitis at this time.  Patient's vital signs are reassuring.  She was given fluids and pain control.  Labs pending.  11:51 AM On reevaluation patient's  symptoms are improved but still having right lower quadrant pain.  Urine is clear, CBC, CMP, lipase are all within normal limits.  Patient did have a CT scan in July which showed no evidence of kidney stones but did show concern for ovarian cyst.  She said her last menses was longer than normal and she has been under a lot of stress.  On pelvic exam she does have some tenderness in the right adnexal region but no significant discharge and she is sexually active with only 1 partner for the last 2 years.  We will do ultrasound to further evaluate and try to avoid radiation.  1:59 PM Wet prep with no significant findings, ultrasound shows small focal fluid collection or cystic structure in the right pelvis superior to the right ovary that they recommend further evaluation with CT.  With repeat evaluation patient is now having worsening pain again in the right pelvic area.  CT ordered.  5:33 PM Patient CT shows cholelithiasis and and abnormal diffuse heterogeneous lesions of the liver which may just be from early phase of imaging.  Patient has no evidence of hepatitis on labs.  On reevaluation patient is still having abdominal pain and will consult with general surgery to come and evaluate  Final Clinical Impressions(s) / ED Diagnoses   Final diagnoses:  None    ED Discharge Orders    None       Blanchie Dessert, MD 01/17/19 1734

## 2019-01-17 NOTE — ED Notes (Signed)
Ultrasound at bedside

## 2019-01-17 NOTE — ED Triage Notes (Signed)
Pt states she started passing blood clots in her stool this am, pain in lower rt quad

## 2019-01-17 NOTE — ED Notes (Signed)
Patient transported to CT 

## 2019-01-17 NOTE — ED Provider Notes (Signed)
Patient with right upper quadrant abdominal pain and questionable gallstones.  I spoke with general surgery and they wanted to get an ultrasound of her abdomen first.  The ultrasound did not see any gallstones.  General surgery saw the patient and felt like she could be discharged home with follow-up as an outpatient.  Patient was given some Bentyl for home and referred to Rickey Primus, MD 01/17/19 [phone]

## 2019-01-17 NOTE — ED Notes (Signed)
Pt verbalized discharge instructions and follow up care. Alert and ambulatory. No IV. Has a ride home.  

## 2019-01-17 NOTE — ED Notes (Signed)
ED Provider at bedside. 

## 2019-01-17 NOTE — Consult Note (Signed)
Reason for Consult:abdominal pain Referring Physician: Dr. Jannet Askew Kristi Harmon is an 23 y.o. female.  HPI:  I was asked to see this 23 year old female with abdominal pain.  She has been in the ER for over 9 hours.  She reports that her pain is 10 out of 10 in the right flank.  Surgery was asked to see the patient after a CAT scan of the abdomen and pelvis with contrast suggested noncalcified gallstones.  She had a completely normal CAT scan of the abdomen and pelvis with no gallstones in July of this year and has had a normal ultrasound of the gallbladder in 2017.  Denies nausea or vomiting.  The pain is sharp and constant.  It refers both to the right upper quadrant and right lower quadrant.  Her vital signs of been normal including a normal heart rate.  Her laboratory data including 1 blood count with flexion test are normal.. She was found to have a  Past Medical History:  Diagnosis Date  . Anemia   . Pancreatitis     Past Surgical History:  Procedure Laterality Date  . WISDOM TOOTH EXTRACTION      Family History  Problem Relation Age of Onset  . Alcohol abuse Mother   . Cancer Paternal Grandmother     Social History:  reports that she has quit smoking. Her smoking use included cigarettes. She has never used smokeless tobacco. She reports current alcohol use. She reports current drug use. Drug: Marijuana.  Allergies:  Allergies  Allergen Reactions  . Prozac [Fluoxetine Hcl] Nausea And Vomiting    Medications: I have reviewed the patient's current medications.  Results for orders placed or performed during the hospital encounter of 01/17/19 (from the past 48 hour(s))  CBC with Differential/Platelet     Status: None   Collection Time: 01/17/19  9:31 AM  Result Value Ref Range   WBC 7.4 4.0 - 10.5 K/uL   RBC 4.31 3.87 - 5.11 MIL/uL   Hemoglobin 13.3 12.0 - 15.0 g/dL   HCT 40.9 81.1 - 91.4 %   MCV 94.7 80.0 - 100.0 fL   MCH 30.9 26.0 - 34.0 pg   MCHC 32.6 30.0 - 36.0  g/dL   RDW 78.2 95.6 - 21.3 %   Platelets 224 150 - 400 K/uL   nRBC 0.0 0.0 - 0.2 %   Neutrophils Relative % 64 %   Neutro Abs 4.7 1.7 - 7.7 K/uL   Lymphocytes Relative 28 %   Lymphs Abs 2.1 0.7 - 4.0 K/uL   Monocytes Relative 7 %   Monocytes Absolute 0.5 0.1 - 1.0 K/uL   Eosinophils Relative 1 %   Eosinophils Absolute 0.1 0.0 - 0.5 K/uL   Basophils Relative 0 %   Basophils Absolute 0.0 0.0 - 0.1 K/uL   Immature Granulocytes 0 %   Abs Immature Granulocytes 0.01 0.00 - 0.07 K/uL    Comment: Performed at [facility], [address] [place], [place] [address]  Comprehensive metabolic panel     Status: Abnormal   Collection Time: 01/17/19  9:31 AM  Result Value Ref Range   Sodium 137 135 - 145 mmol/L   Potassium 3.8 3.5 - 5.1 mmol/L   Chloride 105 98 - 111 mmol/L   CO2 22 22 - 32 mmol/L   Glucose, Bld 107 (H) 70 - 99 mg/dL   BUN 11 6 - 20 mg/dL   Creatinine, Ser 8.46 0.44 - 1.00 mg/dL   Calcium 8.8 (  L) 8.9 - 10.3 mg/dL   Total Protein 7.5 6.5 - 8.1 g/dL   Albumin 4.3 3.5 - 5.0 g/dL   AST 11 (L) 15 - 41 U/L   ALT 7 0 - 44 U/L   Alkaline Phosphatase 36 (L) 38 - 126 U/L   Total Bilirubin 0.8 0.3 - 1.2 mg/dL   GFR calc non Af Amer >60 >60 mL/min   GFR calc Af Amer >60 >60 mL/min   Anion gap 10 5 - 15    Comment: Performed at [facility], [address], [place], [place] [address]  Lipase, blood     Status: None   Collection Time: 01/17/19  9:31 AM  Result Value Ref Range   Lipase 28 11 - 51 U/L    Comment: Performed at [facility], [address] [place], [place] [address]  Urinalysis, Routine w reflex microscopic     Status: Abnormal   Collection Time: 01/17/19 11:03 AM  Result Value Ref Range   Color, Urine YELLOW YELLOW   APPearance CLEAR CLEAR   Specific Gravity, Urine 1.013 1.005 - 1.030   pH 6.0 5.0 - 8.0   Glucose, UA NEGATIVE NEGATIVE mg/dL   Hgb urine dipstick NEGATIVE NEGATIVE   Bilirubin Urine  NEGATIVE NEGATIVE   Ketones, ur 5 (A) NEGATIVE mg/dL   Protein, ur NEGATIVE NEGATIVE mg/dL   Nitrite NEGATIVE NEGATIVE   Leukocytes,Ua NEGATIVE NEGATIVE    Comment: Performed at [facility], [address], [place], [place] [address]  Wet prep, genital     Status: Abnormal   Collection Time: 01/17/19 11:22 AM   Specimen: Genital  Result Value Ref Range   Yeast Wet Prep HPF POC NONE SEEN NONE SEEN   Trich, Wet Prep NONE SEEN NONE SEEN   Clue Cells Wet Prep HPF POC PRESENT (A) NONE SEEN   WBC, Wet Prep HPF POC MANY (A) NONE SEEN   Sperm NONE SEEN     Comment: Performed at [facility], [address] [place], [place] [address]    US Abdomen Complete  Result Date: 01/17/2019 CLINICAL DATA:  Pain EXAM: ABDOMEN ULTRASOUND COMPLETE COMPARISON:  CT today FINDINGS: Gallbladder: No visible stones. No wall thickening or sonographic Murphy sign. Common bile duct: Diameter: Normal caliber, 3 mm. Liver: No focal lesion identified. Within normal limits in parenchymal echogenicity. Portal vein is patent on color Doppler imaging with normal direction of blood flow towards the liver. IVC: No abnormality visualized. Pancreas: Visualized portion unremarkable. Spleen: Size and appearance within normal limits. Right Kidney: Length: 9.9 cm. Echogenicity within normal limits. No mass or hydronephrosis visualized. Left Kidney: Length: 9.2 cm. Echogenicity within normal limits. No mass or hydronephrosis visualized. Abdominal aorta: No aneurysm visualized. Other findings: None. IMPRESSION: No gallstones. No acute findings. Electronically Signed   By: Charlett Nose M.D.   On: 01/17/2019 19:12   US Transvaginal Non-ob  Result Date: 01/17/2019 CLINICAL DATA:  Right-sided pelvic pain for 1 day. EXAM: TRANSABDOMINAL AND TRANSVAGINAL ULTRASOUND OF PELVIS DOPPLER ULTRASOUND OF OVARIES TECHNIQUE: Both transabdominal and transvaginal ultrasound examinations of the pelvis were performed.  Transabdominal technique was performed for global imaging of the pelvis including uterus, ovaries, adnexal regions, and pelvic cul-de-sac. It was necessary to proceed with endovaginal exam following the transabdominal exam to visualize the ovaries and endometrium. Color and duplex Doppler ultrasound was utilized to evaluate blood flow to the ovaries. COMPARISON:  CT scan 09/29/2018 FINDINGS: Uterus Measurements: 7.5 x 3.4 x  4.7 cm = volume: 62.2 mL. No myometrial abnormalities are identified. Small nabothian cyst noted at the cervix. Endometrium Thickness: 2.4 mm.  No focal abnormality visualized. Right ovary Measurements: 3.5 x 1.7 x 3.0 cm = volume: 9.1 mL. Normal appearance/no adnexal mass. Left ovary Measurements: 2.2 x 1.1 x 3.0 cm = volume: 3.7 mL. Normal appearance/no adnexal mass. Pulsed Doppler evaluation of both ovaries demonstrates normal low-resistance arterial and venous waveforms. Other findings Trace free pelvic fluid in the right lower quadrant near the liver edge. There is also a small focal area of fluid in the right lower quadrant/right pelvis superior to the right ovary which measures 2.2 x 2.1 x 1.8 cm. This could be a benign peritoneal cyst or paraovarian cyst. If symptoms persist CT may be helpful for further evaluation. IMPRESSION: 1. Normal sonographic appearance of the uterus and both ovaries. 2. Small amount of free pelvic fluid. 3. Small focal fluid collection or cystic structure in the right pelvis superior to the right ovary could be a paraovarian cyst or other peritoneal cyst. If symptoms persist CT may be helpful for further evaluation. Electronically Signed   By: Marijo Sanes M.D.   On: 01/17/2019 13:32   US Pelvis Complete  Result Date: 01/17/2019 CLINICAL DATA:  Right-sided pelvic pain for 1 day. EXAM: TRANSABDOMINAL AND TRANSVAGINAL ULTRASOUND OF PELVIS DOPPLER ULTRASOUND OF OVARIES TECHNIQUE: Both transabdominal and transvaginal ultrasound examinations of the pelvis were  performed. Transabdominal technique was performed for global imaging of the pelvis including uterus, ovaries, adnexal regions, and pelvic cul-de-sac. It was necessary to proceed with endovaginal exam following the transabdominal exam to visualize the ovaries and endometrium. Color and duplex Doppler ultrasound was utilized to evaluate blood flow to the ovaries. COMPARISON:  CT scan 09/29/2018 FINDINGS: Uterus Measurements: 7.5 x 3.4 x 4.7 cm = volume: 62.2 mL. No myometrial abnormalities are identified. Small nabothian cyst noted at the cervix. Endometrium Thickness: 2.4 mm.  No focal abnormality visualized. Right ovary Measurements: 3.5 x 1.7 x 3.0 cm = volume: 9.1 mL. Normal appearance/no adnexal mass. Left ovary Measurements: 2.2 x 1.1 x 3.0 cm = volume: 3.7 mL. Normal appearance/no adnexal mass. Pulsed Doppler evaluation of both ovaries demonstrates normal low-resistance arterial and venous waveforms. Other findings Trace free pelvic fluid in the right lower quadrant near the liver edge. There is also a small focal area of fluid in the right lower quadrant/right pelvis superior to the right ovary which measures 2.2 x 2.1 x 1.8 cm. This could be a benign peritoneal cyst or paraovarian cyst. If symptoms persist CT may be helpful for further evaluation. IMPRESSION: 1. Normal sonographic appearance of the uterus and both ovaries. 2. Small amount of free pelvic fluid. 3. Small focal fluid collection or cystic structure in the right pelvis superior to the right ovary could be a paraovarian cyst or other peritoneal cyst. If symptoms persist CT may be helpful for further evaluation. Electronically Signed   By: Marijo Sanes M.D.   On: 01/17/2019 13:32   Ct Abdomen Pelvis W Contrast  Result Date: 01/17/2019 CLINICAL DATA:  Right lower quadrant abdominal pain. Blood in the stools this morning. 2.2 cm focal area of fluid in the right lower quadrant of the abdomen/right pelvis on pelvic ultrasound earlier today. EXAM:  CT ABDOMEN AND PELVIS WITH CONTRAST TECHNIQUE: Multidetector CT imaging of the abdomen and pelvis was performed using the standard protocol following bolus administration of intravenous contrast. CONTRAST:  20mL OMNIPAQUE IOHEXOL 300 MG/ML SOLN, 188mL OMNIPAQUE IOHEXOL 300  MG/ML SOLN COMPARISON:  Pelvic ultrasound obtained earlier today. Abdomen and pelvis CT dated 09/29/2018. Abdomen ultrasound dated 02/25/2016 FINDINGS: Lower chest: Unremarkable. Hepatobiliary: Probable multiple small, noncalcified gallstones in the dependent portion of the gallbladder, measuring up to 4 mm in maximum diameter each. No gallbladder wall thickening or pericholecystic fluid. Mildly diffusely heterogeneous liver with linear areas of decreased density throughout, not previously seen. Pancreas: Unremarkable. No pancreatic ductal dilatation or surrounding inflammatory changes. Spleen: Normal in size without focal abnormality. Adrenals/Urinary Tract: Adrenal glands are unremarkable. Kidneys are normal, without renal calculi, focal lesion, or hydronephrosis. Bladder is unremarkable. Stomach/Bowel: Stomach is within normal limits. Appendix appears normal. No evidence of bowel wall thickening, distention, or inflammatory changes. Vascular/Lymphatic: No significant vascular findings are present. No enlarged abdominal or pelvic lymph nodes. Reproductive: Uterus and bilateral adnexa are unremarkable. Other: Small amount of free peritoneal fluid in the pelvic cul-de-sac, within normal limits of physiological fluid. No separate right pelvic fluid collection. 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 There are moderate degenerative changes at the pseudo articulations. Mild dextroconvex lower lumbar scoliosis. IMPRESSION: 1. Mildly diffusely heterogeneous liver with linear areas of decreased density throughout both lobes, not previously seen. This appearance can be seen with acute  hepatitis. This could also represent diffusely unopacified a hepatic veins due to early phase imaging. 2. Probable multiple small, noncalcified gallstones in the dependent portion of the gallbladder without evidence of cholecystitis. 3. Small amount of free peritoneal fluid in the pelvic cul-de-sac, within normal limits of physiological fluid, with no separate right pelvic fluid collection. 4. 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 Electronically Signed   By: Beckie SaltsSteven  Reid M.D.   On: 01/17/2019 17:09   [place] Art/ven Flow Abd Pelv Doppler  Result Date: 01/17/2019 CLINICAL DATA:  Right-sided pelvic pain for 1 day. EXAM: TRANSABDOMINAL AND TRANSVAGINAL ULTRASOUND OF PELVIS DOPPLER ULTRASOUND OF OVARIES TECHNIQUE: Both transabdominal and transvaginal ultrasound examinations of the pelvis were performed. Transabdominal technique was performed for global imaging of the pelvis including uterus, ovaries, adnexal regions, and pelvic cul-de-sac. It was necessary to proceed with endovaginal exam following the transabdominal exam to visualize the ovaries and endometrium. Color and duplex Doppler ultrasound was utilized to evaluate blood flow to the ovaries. COMPARISON:  CT scan 09/29/2018 FINDINGS: Uterus Measurements: 7.5 x 3.4 x 4.7 cm = volume: 62.2 mL. No myometrial abnormalities are identified. Small nabothian cyst noted at the cervix. Endometrium Thickness: 2.4 mm.  No focal abnormality visualized. Right ovary Measurements: 3.5 x 1.7 x 3.0 cm = volume: 9.1 mL. Normal appearance/no adnexal mass. Left ovary Measurements: 2.2 x 1.1 x 3.0 cm = volume: 3.7 mL. Normal appearance/no adnexal mass. Pulsed Doppler evaluation of both ovaries demonstrates normal low-resistance arterial and venous waveforms. Other findings Trace free pelvic fluid in the right lower quadrant near the liver edge. There is also a small focal area of fluid  in the right lower quadrant/right pelvis superior to the right ovary which measures 2.2 x 2.1 x 1.8 cm. This could be a benign peritoneal cyst or paraovarian cyst. If symptoms persist CT may be helpful for further evaluation. IMPRESSION: 1. Normal sonographic appearance of the uterus and both ovaries. 2. Small amount of free pelvic fluid. 3. Small focal fluid collection or cystic structure in the right pelvis superior to the right ovary could be a paraovarian cyst or other peritoneal cyst. If symptoms persist CT  may be helpful for further evaluation. Electronically Signed   By: Rudie Meyer M.D.   On: 01/17/2019 13:32    Review of Systems  All other systems reviewed and are negative.  Blood pressure 113/69, pulse 78, temperature 98.2 F (36.8 C), temperature source Oral, resp. rate 18, height  (1.575 m), weight 49.9 kg, last menstrual period 01/01/2019, SpO2 99 %. Physical Exam  Constitutional: She appears well-developed and well-nourished. No distress.  Cardiovascular: Normal rate, regular rhythm and normal heart sounds.  Respiratory: Effort normal and breath sounds normal.  GI: Soft. She exhibits no distension.  Her abdomen is soft.  It seems nontender when distracted.  Her tenderness is definitely out of proportion to her physical examination.  There is no frank peritonitis.  Skin: She is not diaphoretic.    Assessment/Plan: Abdominal pain of unknown etiology.  Patient was getting other ultrasound of her abdomen and pelvis.  This shows a normal gallbladder with no gallstones, no gallbladder wall thickening, normal bile duct, and no pericholecystic fluid From a general surgical standpoint, I have no clear cause of your abdominal pain.  And, her gallbladder and appendix were normal.  There are no acute findings on the CAT scan we will follow for suggesting the general surgical issue.  From our standpoint, there is nothing further to offer.  We will see her as needed.  Abigail Miyamoto 01/17/2019, 7:24 PM

## 2019-01-17 NOTE — Discharge Instructions (Addendum)
Make an appointment to follow up with [facility]

## 2019-01-18 LAB — GC/CHLAMYDIA PROBE AMP (~~LOC~~) NOT AT ARMC
Chlamydia: NEGATIVE
Neisseria Gonorrhea: NEGATIVE

## 2019-10-26 ENCOUNTER — Other Ambulatory Visit: Payer: Self-pay

## 2019-10-26 ENCOUNTER — Encounter (HOSPITAL_COMMUNITY): Payer: Self-pay | Admitting: Emergency Medicine

## 2019-10-26 ENCOUNTER — Emergency Department (HOSPITAL_COMMUNITY)
Admission: EM | Admit: 2019-10-26 | Discharge: 2019-10-26 | Disposition: A | Discharge status: Home / Self Care | Payer: Self-pay | Attending: Emergency Medicine | Admitting: Emergency Medicine

## 2019-10-26 DIAGNOSIS — R1031 Right lower quadrant pain: Secondary | ICD-10-CM | POA: Insufficient documentation

## 2019-10-26 DIAGNOSIS — Z5321 Procedure and treatment not carried out due to patient leaving prior to being seen by health care provider: Secondary | ICD-10-CM | POA: Insufficient documentation

## 2019-10-26 LAB — COMPREHENSIVE METABOLIC PANEL
ALT: 12 U/L (ref 0–44)
AST: 16 U/L (ref 15–41)
Albumin: 4.5 g/dL (ref 3.5–5.0)
Alkaline Phosphatase: 39 U/L (ref 38–126)
Anion gap: 13 (ref 5–15)
BUN: 12 mg/dL (ref 6–20)
CO2: 20 mmol/L — ABNORMAL LOW (ref 22–32)
Calcium: 9.7 mg/dL (ref 8.9–10.3)
Chloride: 103 mmol/L (ref 98–111)
Creatinine, Ser: 0.82 mg/dL (ref 0.44–1.00)
GFR calc Af Amer: >60 mL/min (ref >60)
GFR calc non Af Amer: >60 mL/min (ref >60)
Glucose, Bld: 122 mg/dL — ABNORMAL HIGH (ref 70–99)
Potassium: 3.8 mmol/L (ref 3.5–5.1)
Sodium: 136 mmol/L (ref 135–145)
Total Bilirubin: 0.8 mg/dL (ref 0.3–1.2)
Total Protein: 7.6 g/dL (ref 6.5–8.1)

## 2019-10-26 LAB — I-STAT BETA HCG BLOOD, ED (MC, WL, AP ONLY): I-stat hCG, quantitative: <5 m[IU]/mL (ref <5)

## 2019-10-26 LAB — CBC
HCT: 42.2 % (ref 36.0–46.0)
Hemoglobin: 13.7 g/dL (ref 12.0–15.0)
MCH: 31.1 pg (ref 26.0–34.0)
MCHC: 32.5 g/dL (ref 30.0–36.0)
MCV: 95.9 fL (ref 80.0–100.0)
Platelets: 261 10*3/uL (ref 150–400)
RBC: 4.4 MIL/uL (ref 3.87–5.11)
RDW: 12.9 % (ref 11.5–15.5)
WBC: 10.5 10*3/uL (ref 4.0–10.5)
nRBC: 0 % (ref 0.0–0.2)

## 2019-10-26 LAB — LIPASE, BLOOD: Lipase: 96 U/L — ABNORMAL HIGH (ref 11–51)

## 2019-10-26 NOTE — ED Triage Notes (Signed)
Pt reports that she had heart burn earlier and took Tums and water then about hour ago started having RLQ pains. Denies n/v/d or urinary problems.

## 2020-04-04 ENCOUNTER — Emergency Department (HOSPITAL_COMMUNITY): Payer: Self-pay

## 2020-04-04 ENCOUNTER — Emergency Department (HOSPITAL_COMMUNITY)
Admission: EM | Admit: 2020-04-04 | Discharge: 2020-04-04 | Disposition: A | Discharge status: Home / Self Care | Payer: Self-pay | Attending: Emergency Medicine | Admitting: Emergency Medicine

## 2020-04-04 ENCOUNTER — Encounter (HOSPITAL_COMMUNITY): Payer: Self-pay | Admitting: Radiology

## 2020-04-04 ENCOUNTER — Other Ambulatory Visit: Payer: Self-pay

## 2020-04-04 DIAGNOSIS — R0602 Shortness of breath: Secondary | ICD-10-CM

## 2020-04-04 DIAGNOSIS — R5383 Other fatigue: Secondary | ICD-10-CM | POA: Insufficient documentation

## 2020-04-04 DIAGNOSIS — R079 Chest pain, unspecified: Secondary | ICD-10-CM

## 2020-04-04 DIAGNOSIS — Z87891 Personal history of nicotine dependence: Secondary | ICD-10-CM | POA: Insufficient documentation

## 2020-04-04 DIAGNOSIS — Z20822 Contact with and (suspected) exposure to covid-19: Secondary | ICD-10-CM | POA: Insufficient documentation

## 2020-04-04 DIAGNOSIS — R000 Tachycardia, unspecified: Secondary | ICD-10-CM

## 2020-04-04 DIAGNOSIS — F1290 Cannabis use, unspecified, uncomplicated: Secondary | ICD-10-CM

## 2020-04-04 LAB — URINALYSIS, ROUTINE W REFLEX MICROSCOPIC
Bilirubin Urine: NEGATIVE
Glucose, UA: NEGATIVE mg/dL
Hgb urine dipstick: NEGATIVE
Ketones, ur: 20 mg/dL — AB
Leukocytes,Ua: NEGATIVE
Nitrite: NEGATIVE
Protein, ur: NEGATIVE mg/dL
Specific Gravity, Urine: 1.02 (ref 1.005–1.030)
pH: 7 (ref 5.0–8.0)

## 2020-04-04 LAB — HEPATIC FUNCTION PANEL
ALT: 13 U/L (ref 0–44)
AST: 21 U/L (ref 15–41)
Albumin: 4.9 g/dL (ref 3.5–5.0)
Alkaline Phosphatase: 50 U/L (ref 38–126)
Bilirubin, Direct: <0.1 mg/dL (ref 0.0–0.2)
Total Bilirubin: 0.7 mg/dL (ref 0.3–1.2)
Total Protein: 8.3 g/dL — ABNORMAL HIGH (ref 6.5–8.1)

## 2020-04-04 LAB — RAPID URINE DRUG SCREEN, HOSP PERFORMED
Amphetamines: NOT DETECTED
Barbiturates: NOT DETECTED
Benzodiazepines: NOT DETECTED
Cocaine: NOT DETECTED
Opiates: NOT DETECTED
Tetrahydrocannabinol: POSITIVE — AB

## 2020-04-04 LAB — CBC
HCT: 41.9 % (ref 36.0–46.0)
Hemoglobin: 14.2 g/dL (ref 12.0–15.0)
MCH: 31.3 pg (ref 26.0–34.0)
MCHC: 33.9 g/dL (ref 30.0–36.0)
MCV: 92.5 fL (ref 80.0–100.0)
Platelets: 335 10*3/uL (ref 150–400)
RBC: 4.53 MIL/uL (ref 3.87–5.11)
RDW: 13.5 % (ref 11.5–15.5)
WBC: 11.8 10*3/uL — ABNORMAL HIGH (ref 4.0–10.5)
nRBC: 0 % (ref 0.0–0.2)

## 2020-04-04 LAB — BASIC METABOLIC PANEL
Anion gap: 15 (ref 5–15)
BUN: 12 mg/dL (ref 6–20)
CO2: 21 mmol/L — ABNORMAL LOW (ref 22–32)
Calcium: 10.1 mg/dL (ref 8.9–10.3)
Chloride: 102 mmol/L (ref 98–111)
Creatinine, Ser: 0.94 mg/dL (ref 0.44–1.00)
GFR, Estimated: >60 mL/min (ref >60)
Glucose, Bld: 126 mg/dL — ABNORMAL HIGH (ref 70–99)
Potassium: 3.9 mmol/L (ref 3.5–5.1)
Sodium: 138 mmol/L (ref 135–145)

## 2020-04-04 LAB — I-STAT BETA HCG BLOOD, ED (NOT ORDERABLE): I-stat hCG, quantitative: <5 m[IU]/mL (ref <5)

## 2020-04-04 LAB — ETHANOL: Alcohol, Ethyl (B): <10 mg/dL (ref <10)

## 2020-04-04 LAB — TROPONIN I (HIGH SENSITIVITY)
Troponin I (High Sensitivity): <2 ng/L (ref <18)
Troponin I (High Sensitivity): <2 ng/L (ref <18)

## 2020-04-04 LAB — TSH: TSH: 1.891 u[IU]/mL (ref 0.350–4.500)

## 2020-04-04 LAB — POC SARS CORONAVIRUS 2 AG -  ED: SARS Coronavirus 2 Ag: NEGATIVE

## 2020-04-04 LAB — D-DIMER, QUANTITATIVE: D-Dimer, Quant: 0.28 ug/mL-FEU (ref 0.00–0.50)

## 2020-04-04 MED ORDER — IOHEXOL 350 MG/ML SOLN
100.0000 mL | Freq: Once | INTRAVENOUS | Status: AC | PRN
  Administered 2020-04-04: 80 mL via INTRAVENOUS

## 2020-04-04 MED ORDER — SODIUM CHLORIDE 0.9 % IV BOLUS
500.0000 mL | Freq: Once | INTRAVENOUS | Status: AC
  Administered 2020-04-04: 500 mL via INTRAVENOUS

## 2020-04-04 MED ORDER — LORAZEPAM 2 MG/ML IJ SOLN
1.0000 mg | Freq: Once | INTRAMUSCULAR | Status: AC
  Administered 2020-04-04: 1 mg via INTRAVENOUS
  Filled 2020-04-04: qty 1

## 2020-04-04 MED ORDER — LACTATED RINGERS IV BOLUS
1000.0000 mL | Freq: Once | INTRAVENOUS | Status: AC
  Administered 2020-04-04: 1000 mL via INTRAVENOUS

## 2020-04-04 NOTE — Discharge Instructions (Addendum)
Tensive work-up here today without any acute findings.  Not able to explain her symptoms completely.  Nothing that appears dangerous or worrisome.  Recommend follow-up with the wellness clinic.  Returning for any new or worse symptoms.

## 2020-04-04 NOTE — ED Provider Notes (Signed)
Kristi Harmon   CSN: [id number] Arrival date & time: 04/04/20  1255     History No chief complaint on file.   Kristi Harmon is a 25 y.o. female.  HPI Patient reports that she has not felt well for 4 days.  She reports starting Friday she has had fatigue, felt short of breath.  She reports for several days is felt like she cannot catch her breath.  She reports today she started feeling like she has a chest pain in the center of her chest as well.  Today she is breathing much harder and has tingling in her hands.  Patient reports that she had a similar episode about a year ago.  Symptoms resolved after treatment at that time.  He does not have any known cardiac history. Patient reports she does smoke marijuana daily.  She has not smoked this morning.  She also smokes cigarettes.  She denies other drugs of abuse.    Past Medical History:  Diagnosis Date  . Anemia   . Pancreatitis     There are no problems to display for this patient.   Past Surgical History:  Procedure Laterality Date  . WISDOM TOOTH EXTRACTION       OB History   No obstetric history on file.     Family History  Problem Relation Age of Onset  . Alcohol abuse Mother   . Cancer Paternal Grandmother     Social History   Tobacco Use  . Smoking status: Former Smoker    Types: Cigarettes  . Smokeless tobacco: Never Used  Vaping Use  . Vaping Use: Never used  Substance Use Topics  . Alcohol use: Yes  . Drug use: Yes    Types: Marijuana    Home Medications Prior to Admission medications   Medication Sig Start Date End Date Taking? Authorizing Provider  diphenhydramine-acetaminophen (TYLENOL PM) 25-500 MG TABS tablet Take 2 tablets by mouth at bedtime as needed (headache/pain/sleep).   Yes [provider]  polyvinyl alcohol (LIQUIFILM TEARS) 1.4 % ophthalmic solution Place 1 drop into both eyes as needed for dry eyes.   Yes [provider]  promethazine (PHENERGAN) 25 MG tablet Take 1 tablet (25 mg total) by mouth every 6 (six) hours as needed for nausea or vomiting. Patient not taking: Reported on 05/12/2017 02/25/16 09/12/18  Ward, Layla Maw, DO    Allergies    Prozac [fluoxetine hcl]  Review of Systems   Review of Systems 10 systems reviewed and negative except as per HPI Physical Exam Updated Vital Signs BP 112/73   Pulse (!) 129   Temp 98.5 F (36.9 C) (Oral)   Resp (!) 22   SpO2 99%   Physical Exam Constitutional:      Comments: Patient is alert.  Well-nourished well-developed.  Very anxious in appearance and hyperventilating.  Color is good.  HENT:     Head: Normocephalic and atraumatic.     Mouth/Throat:     Mouth: Mucous membranes are moist.     Pharynx: Oropharynx is clear.  Eyes:     Extraocular Movements: Extraocular movements intact.  Cardiovascular:     Comments: Very tachycardic, regular. Pulmonary:     Comments: Hyperventilating.  Good airflow.  Occasional expiratory wheeze. Abdominal:     General: There is no distension.     Palpations: Abdomen is soft.     Tenderness: There is no abdominal tenderness. There is no guarding.  Musculoskeletal:  General: No swelling or tenderness. Normal range of motion.     Right lower leg: No edema.     Left lower leg: No edema.  Skin:    General: Skin is warm and dry.  Neurological:     General: No focal deficit present.     Mental Status: She is oriented to person, place, and time.     Coordination: Coordination normal.  Psychiatric:     Comments: Extremely anxious.     ED Results / Procedures / Treatments   Labs (all labs ordered are listed, but only abnormal results are displayed) Labs Reviewed  BASIC METABOLIC PANEL - Abnormal; Notable for the following components:      Result Value   CO2 21 (*)    Glucose, Bld 126 (*)    All other components within normal limits  CBC - Abnormal; Notable for the following components:   WBC 11.8  (*)    All other components within normal limits  HEPATIC FUNCTION PANEL - Abnormal; Notable for the following components:   Total Protein 8.3 (*)    All other components within normal limits  URINALYSIS, ROUTINE W REFLEX MICROSCOPIC - Abnormal; Notable for the following components:   APPearance HAZY (*)    Ketones, ur 20 (*)    All other components within normal limits  RAPID URINE DRUG SCREEN, HOSP PERFORMED - Abnormal; Notable for the following components:   Tetrahydrocannabinol POSITIVE (*)    All other components within normal limits  ETHANOL  TSH  D-DIMER, QUANTITATIVE (NOT AT [facility])  I-STAT BETA HCG BLOOD, ED ([facility], [facility], [facility] ONLY)  I-STAT BETA HCG BLOOD, ED ([facility], [facility], [facility] ONLY)  POC SARS CORONAVIRUS 2 AG -  ED  I-STAT BETA HCG BLOOD, ED (NOT ORDERABLE)  TROPONIN I (HIGH SENSITIVITY)  TROPONIN I (HIGH SENSITIVITY)    EKG EKG Interpretation  Date/Time:  Wednesday April 04 2020 13:06:37 EST Ventricular Rate:  148 PR Interval:    QRS Duration: 82 QT Interval:  274 QTC Calculation: 430 R Axis:   84 Text Interpretation: Sinus tachycardia Borderline Q waves in lateral leads Borderline repolarization abnormality agree, similar to previous except tachycardia Confirmed by Arby Barrette [phone]) on 04/04/2020 3:42:54 PM   Radiology DG Chest 2 View  Result Date: 04/04/2020 CLINICAL DATA:  Chest pain and shortness of breath.  Fatigue. EXAM: CHEST - 2 VIEW COMPARISON:  09/12/2018 FINDINGS: The heart size and mediastinal contours are within normal limits. Both lungs are clear. The visualized skeletal structures are unremarkable. IMPRESSION: No active cardiopulmonary disease. Electronically Signed   By: Gaylyn Rong M.D.   On: 04/04/2020 13:36    Procedures Procedures (including critical care time)  Medications Ordered in ED Medications  lactated ringers bolus 1,000 mL (has no administration in time range)  LORazepam (ATIVAN) injection 1 mg (1 mg Intravenous Given 04/04/20 1350)   sodium chloride 0.9 % bolus 500 mL (500 mLs Intravenous New Bag/Given 04/04/20 1351)    ED Course  I have reviewed the triage vital signs and the nursing notes.  Pertinent labs & imaging results that were available during my care of the patient were reviewed by me and considered in my medical decision making (see chart for details).    MDM Rules/Calculators/A&P                          Patient presents as outlined.  She does not have significant past medical history.  She describes chest pain  for several days with shortness of breath and feeling lightheaded.  On arrival, patient is very tachycardic in the 140s.  She is actively hyperventilating.  She is given Ativan 1 mg IV.  Labs returned without significant abnormality.  Chest x-ray without acute findings.  Point-of-care COVID-negative.  I went to reassess the patient and she was asleep.  Asleep, monitor shows sinus rhythm less than 110 BPM.  Regular and narrow complex.  Once I awakened the patient, within less than a minute, she began hyperventilating again and heart rate picked up to 130s.  Symptoms appear likely due to anxiety and potentially marijuana consumption.  However, patient is very tachycardic at rest without specific other etiology. At this time will administer 1 L lactated Ringer's for fluid resuscitation and proceed with CT PE study to rule out occult PE or other structural anomaly.  Dr. Estell Harpin to assume care at shift change.  Review of diagnostic studies and final disposition. Final Clinical Impression(s) / ED Diagnoses Final diagnoses:  Sinus tachycardia by electrocardiogram  Chest pain, unspecified type  Shortness of breath  Marijuana use, continuous    Rx / DC Orders ED Discharge Orders    None       Arby Barrette, MD 04/04/20 1547

## 2020-04-04 NOTE — ED Triage Notes (Signed)
Since Friday patient has felt SOB, fatigue and like she cannot catch her breath. HR 169 in triage.

## 2020-04-04 NOTE — ED Provider Notes (Signed)
Patient turned over to me.  Concerns for anxiety and then would develop a rapid heart rate.  Patient also feeling short of breath.  Extensive work-up to include CT angio without any acute findings.  When patient's resting heart rate is low 100s.  When you go into talk with her her heart rate goes up to 140s.  Patient also given IV fluids.  Patient's thyroid-stimulating hormone normal.  Troponins x2 normal.  COVID testing negative.  Feel that there is some component of anxiety.  Whether chemically induced or psychologically induced.  But patient's heart rate is better at rest.  EKG was consistent with a sinus tach.  Feel patient safe for discharge home follow-up with wellness clinic.  Patient was not excepting of this.  Wanted to speak to somebody from administration.  Notified patient's nurse of her wishes.  Patient certainly nontoxic no acute distress.  I think it is important that at rest her vital signs are much more reassuring.  Clinically is appears as if the patient is able to somehow increase her heart rate and then she hyperventilates as well.  None of this is a present at rest.   Vanetta Mulders, MD 04/04/20 1800

## 2020-12-06 IMAGING — CR CHEST - 2 VIEW
2 series · 2 of 2 positions shown · non-contrast
Comparison: 05/12/2017

CLINICAL DATA: Syncopal episode.

EXAM:
CHEST - 2 VIEW

[w chest pa]
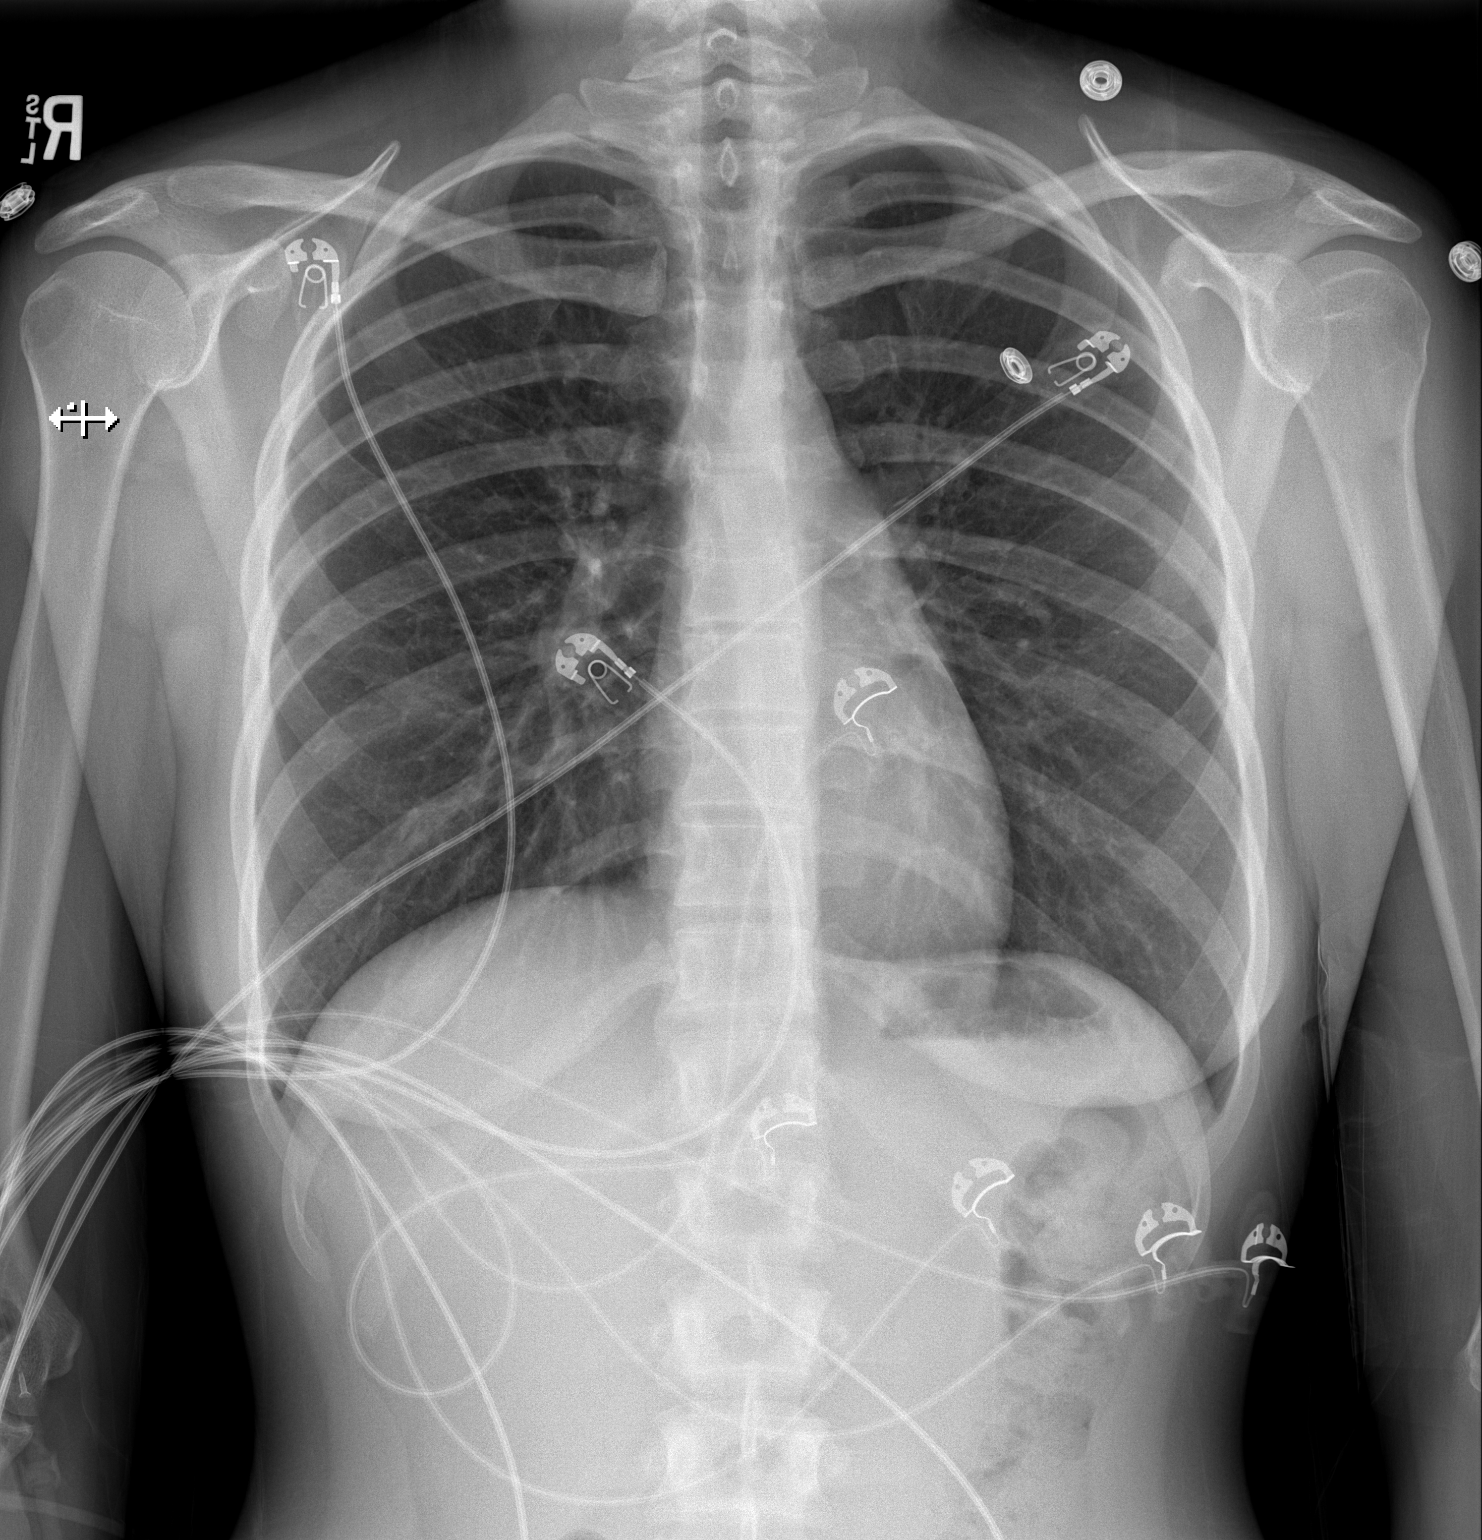

[w chest lat]
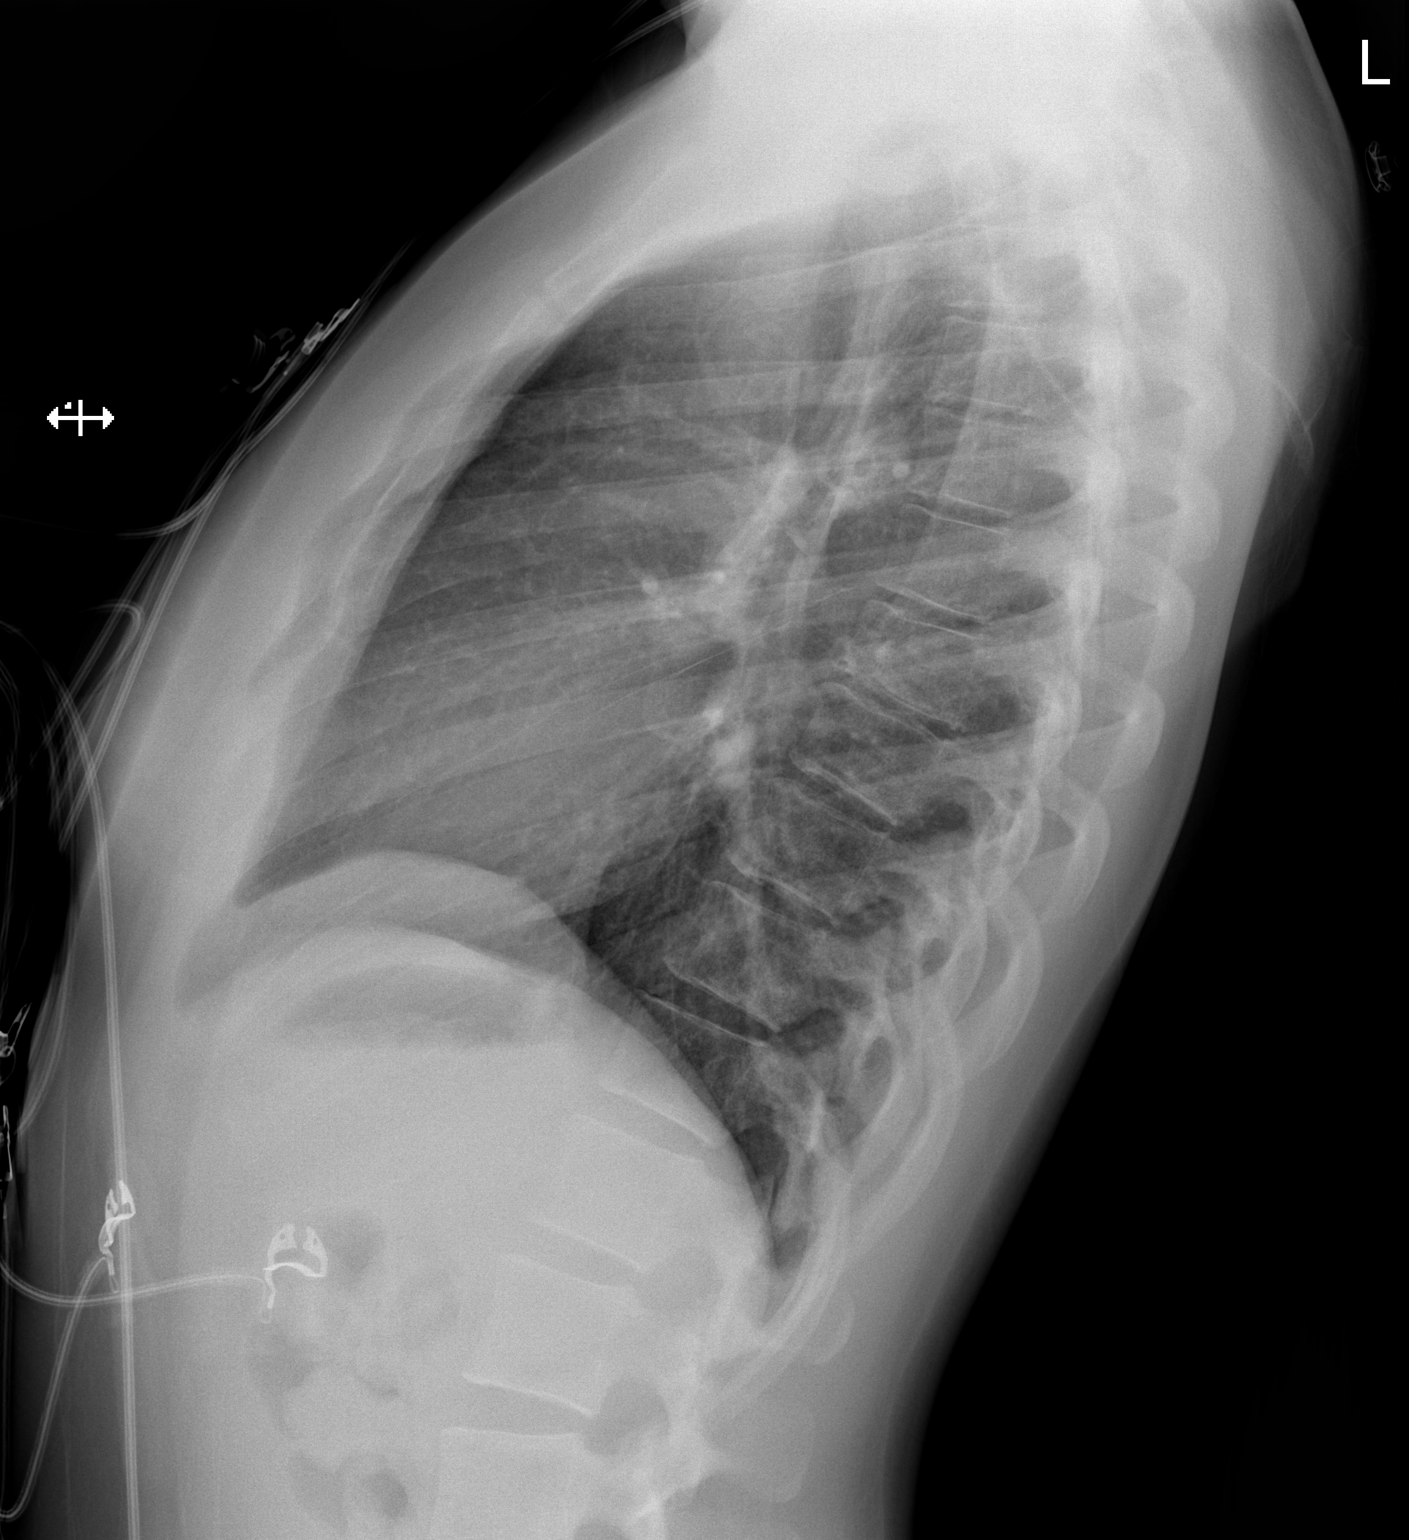

[2 of 2 positions shown; findings below may reference images not displayed]

FINDINGS: The heart size and mediastinal contours are within normal limits.
Both lungs are clear. The visualized skeletal structures are
unremarkable.
IMPRESSION: Negative.  No active cardiopulmonary disease.

## 2021-01-29 ENCOUNTER — Telehealth: Payer: Self-pay | Admitting: Physician Assistant

## 2021-01-29 DIAGNOSIS — B354 Tinea corporis: Secondary | ICD-10-CM

## 2021-01-29 MED ORDER — CLOTRIMAZOLE 1 % EX CREA: 1.0000 "application " | TOPICAL_CREAM | Freq: Two times a day (BID) | CUTANEOUS | 0 refills | Status: DC

## 2021-01-29 NOTE — Progress Notes (Signed)
E Visit for Rash  We are sorry that you are not feeling well. Here is how we plan to help!  Based on what you shared with me it appears you have rash called tinea corporis or ringworm. This is caused by a fungus and is treated with a topical cream.   I have sent a prescription called clotrimazole to help treat your symptoms.    HOME CARE:  Take cool showers and avoid direct sunlight. Apply cool compress or wet dressings. Take a bath in an oatmeal bath.  Sprinkle content of one Aveeno packet under running faucet with comfortably warm water.  Bathe for 15-20 minutes, 1-2 times daily.  Pat dry with a towel. Do not rub the rash. Use hydrocortisone cream. Take an antihistamine like Benadryl for widespread rashes that itch.  The adult dose of Benadryl is 25-50 mg by mouth 4 times daily. Caution:  This type of medication may cause sleepiness.  Do not drink alcohol, drive, or operate dangerous machinery while taking antihistamines.  Do not take these medications if you have prostate enlargement.  Read package instructions thoroughly on all medications that you take.  GET HELP RIGHT AWAY IF:  Symptoms don't go away after treatment. Severe itching that persists. If you rash spreads or swells. If you rash begins to smell. If it blisters and opens or develops a yellow-brown crust. You develop a fever. You have a sore throat. You become short of breath.  MAKE SURE YOU:  Understand these instructions. Will watch your condition. Will get help right away if you are not doing well or get worse.  Thank you for choosing an e-visit.  Your e-visit answers were reviewed by a board certified advanced clinical practitioner to complete your personal care plan. Depending upon the condition, your plan could have included both over the counter or prescription medications.  Please review your pharmacy choice. Make sure the pharmacy is open so you can pick up prescription now. If there is a problem, you may  contact your provider through [employer] and have the prescription routed to another pharmacy.  Your safety is important to [place]. If you have drug allergies check your prescription carefully.   For the next 24 hours you can use MyChart to ask questions about today's visit, request a non-urgent call back, or ask for a work or school excuse. You will get an email in the next two days asking about your experience. I hope that your e-visit has been valuable and will speed your recovery.  Approximately 5 minutes was spent documenting and reviewing patient's chart.

## 2021-03-29 ENCOUNTER — Telehealth: Payer: Self-pay | Admitting: Physician Assistant

## 2021-03-29 DIAGNOSIS — J020 Streptococcal pharyngitis: Secondary | ICD-10-CM

## 2021-03-29 MED ORDER — CEPHALEXIN 500 MG PO CAPS: 500.0000 mg | ORAL_CAPSULE | Freq: Two times a day (BID) | ORAL | 0 refills | Status: DC

## 2021-03-29 NOTE — Progress Notes (Signed)
E-Visit for Sore Throat - Strep Symptoms  We are sorry that you are not feeling well.  Here is how we plan to help!  Based on what you have shared with me it is likely that you have strep pharyngitis.  Strep pharyngitis is inflammation and infection in the back of the throat.  This is an infection cause by bacteria and is treated with antibiotics.  I have prescribed Cephalexin 500 mg twice a day for 10 days. For throat pain, we recommend over the counter oral pain relief medications such as acetaminophen or aspirin, or anti-inflammatory medications such as ibuprofen or naproxen sodium. Topical treatments such as oral throat lozenges or sprays may be used as needed. Strep infections are not as easily transmitted as other respiratory infections, however we still recommend that you avoid close contact with loved ones, especially the very young and elderly.  Remember to wash your hands thoroughly throughout the day as this is the number one way to prevent the spread of infection and wipe down door knobs and counters with disinfectant.   Home Care: Only take medications as instructed by your medical team. Complete the entire course of an antibiotic. Do not take these medications with alcohol. A steam or ultrasonic humidifier can help congestion.  You can place a towel over your head and breathe in the steam from hot water coming from a faucet. Avoid close contacts especially the very young and the elderly. Cover your mouth when you cough or sneeze. Always remember to wash your hands.  Get Help Right Away If: You develop worsening fever or sinus pain. You develop a severe head ache or visual changes. Your symptoms persist after you have completed your treatment plan.  Make sure you Understand these instructions. Will watch your condition. Will get help right away if you are not doing well or get worse.   Thank you for choosing an e-visit.  Your e-visit answers were reviewed by a board  certified advanced clinical practitioner to complete your personal care plan. Depending upon the condition, your plan could have included both over the counter or prescription medications.  Please review your pharmacy choice. Make sure the pharmacy is open so you can pick up prescription now. If there is a problem, you may contact your provider through MyChart messaging and have the prescription routed to another pharmacy.  Your safety is important to us. If you have drug allergies check your prescription carefully.   For the next 24 hours you can use MyChart to ask questions about today's visit, request a non-urgent call back, or ask for a work or school excuse. You will get an email in the next two days asking about your experience. I hope that your e-visit has been valuable and will speed your recovery.  I provided 5 minutes of non face-to-face time during this encounter for chart review and documentation.   

## 2021-04-12 IMAGING — US US ABDOMEN COMPLETE
1 series · 14 of 25 positions shown · non-contrast
Comparison: CT today

CLINICAL DATA: Pain

EXAM:
ABDOMEN ULTRASOUND COMPLETE

[Series 1: us abdomen complete · 14 of 113 slices shown]
[im 1/113]
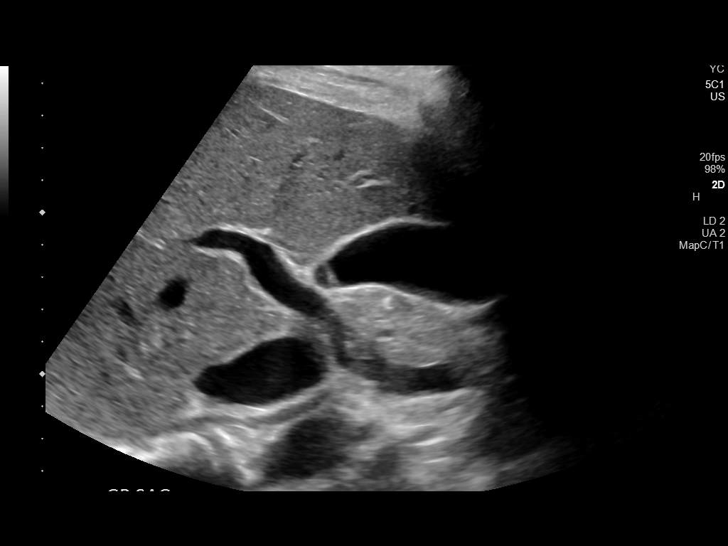
[im 10/113]
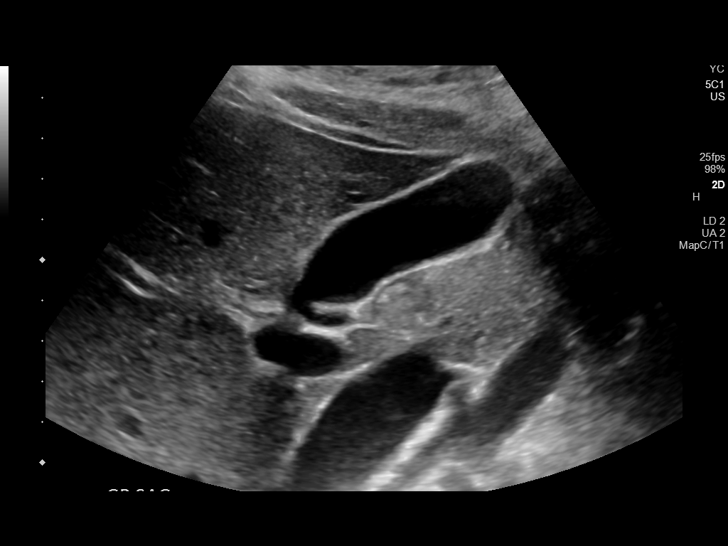
[im 19/113]
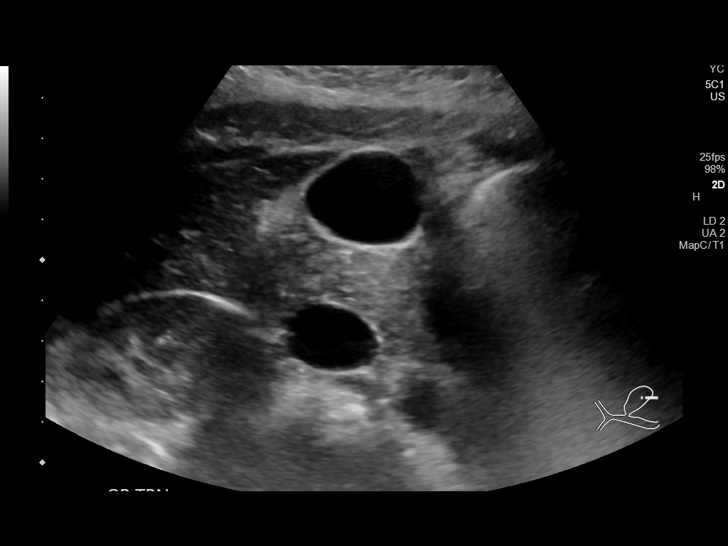
[im 29/113]
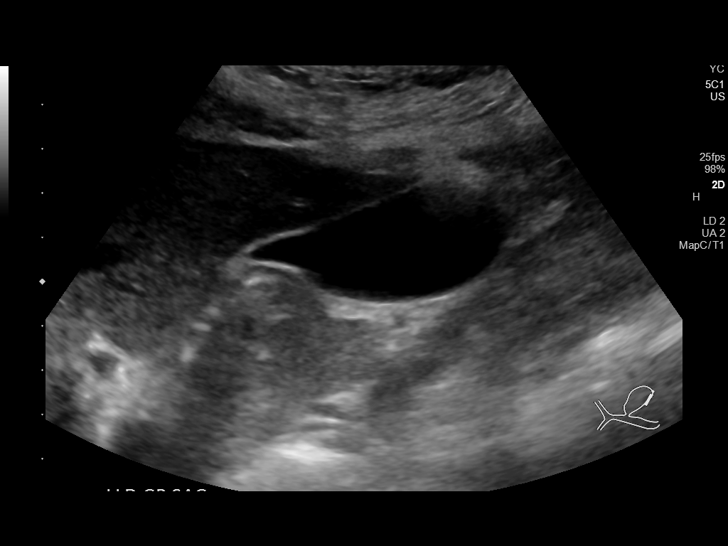
[im 38/113]
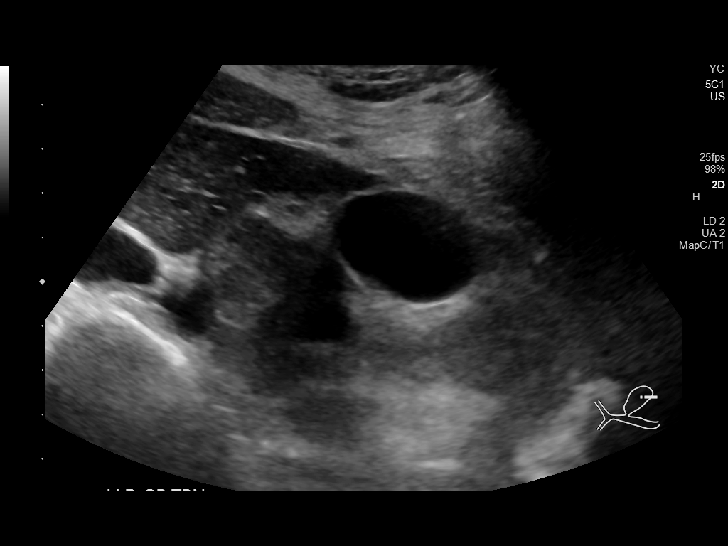
[im 43/113]
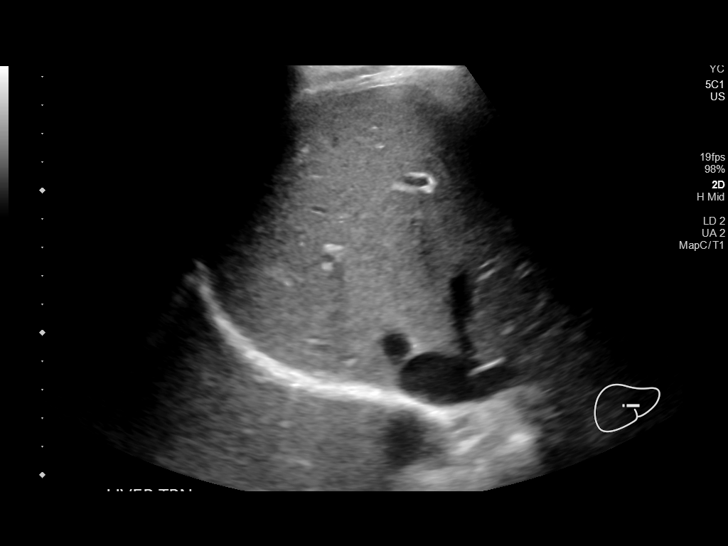
[im 52/113]
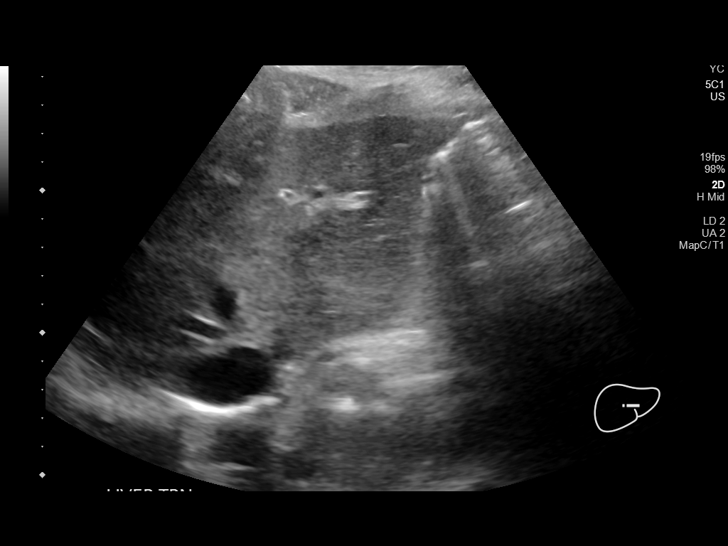
[im 61/113]
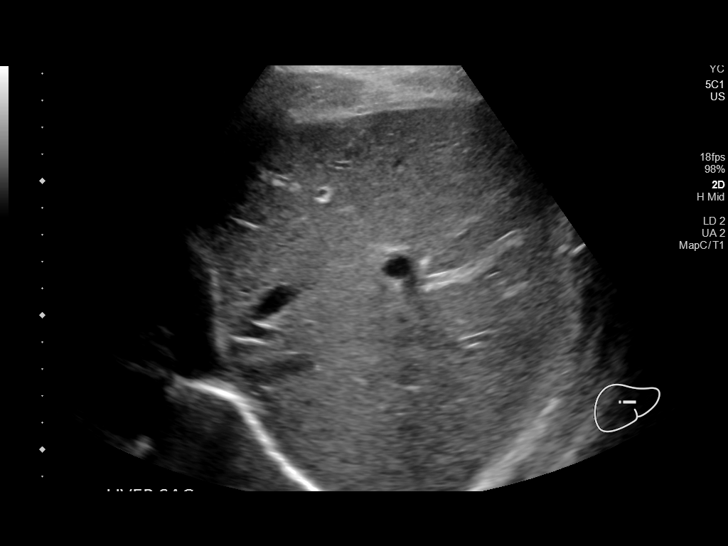
[im 71/113]
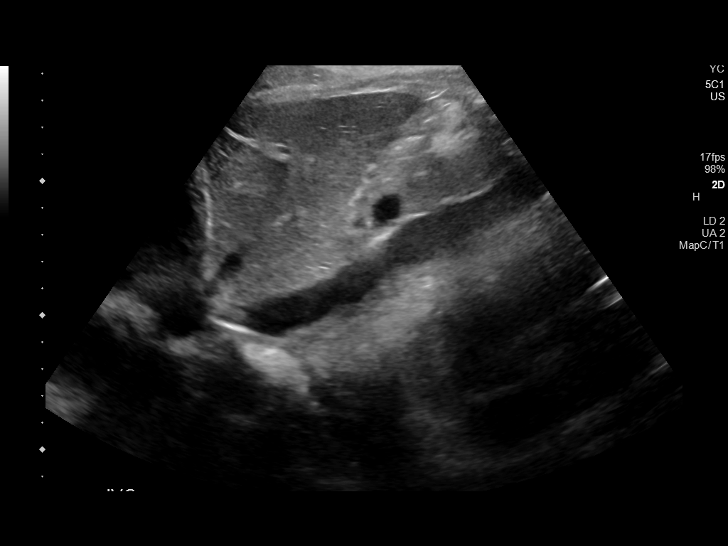
[im 75/113]
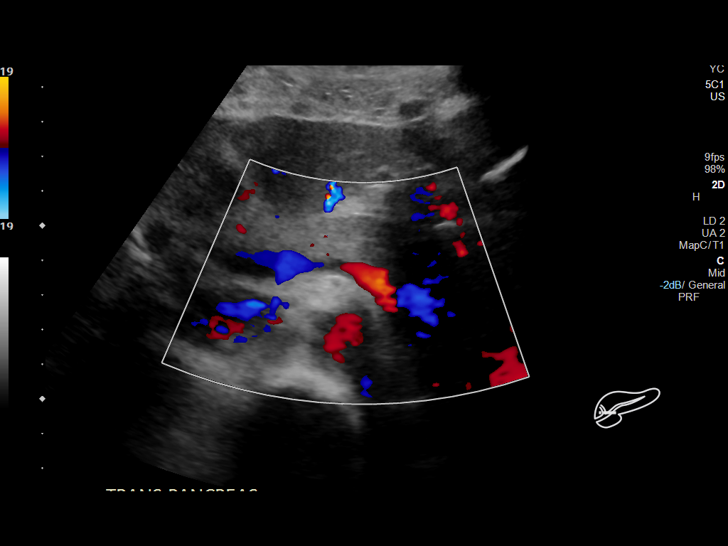
[im 85/113]
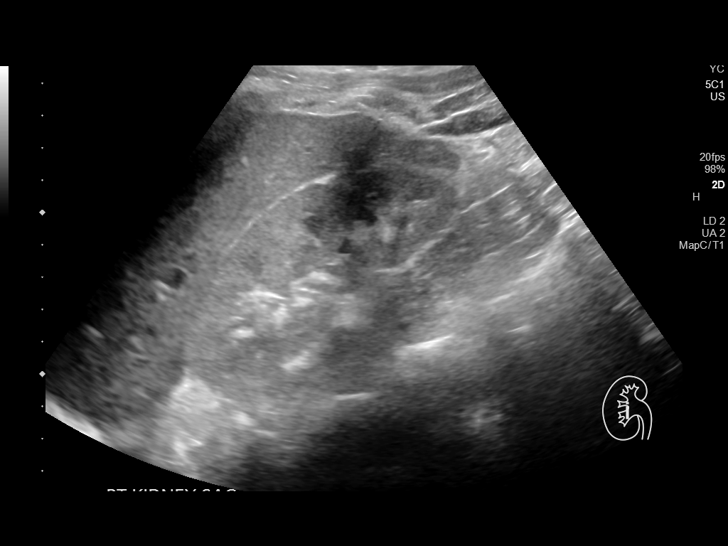
[im 94/113]
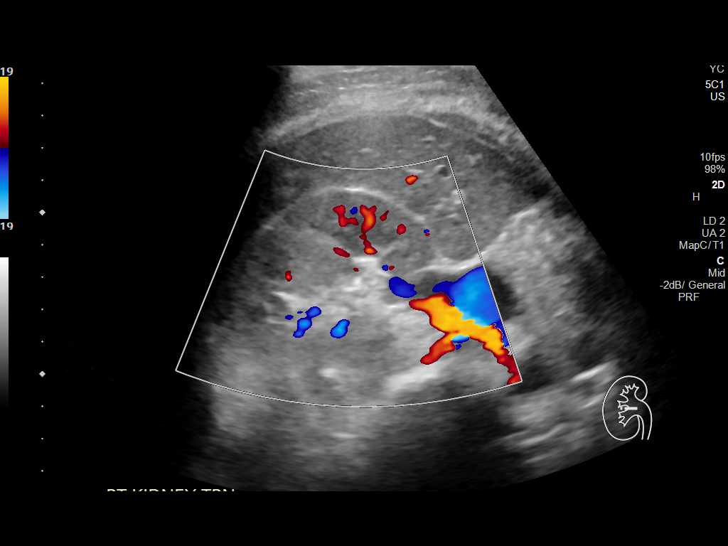
[im 103/113]
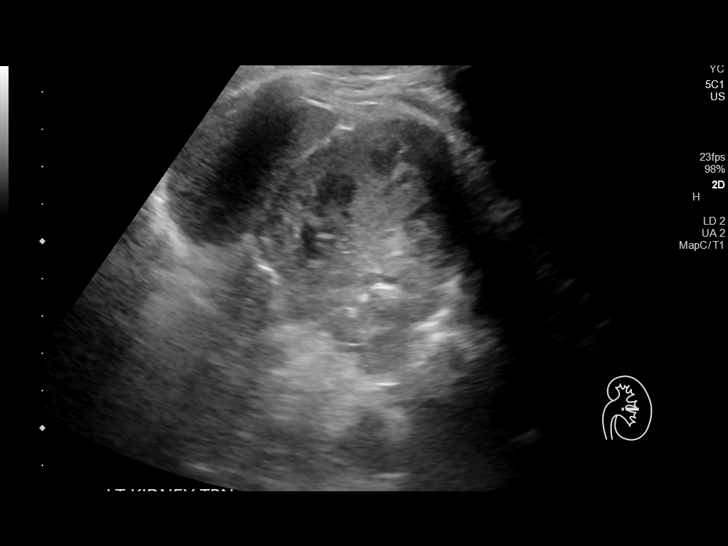
[im 113/113]
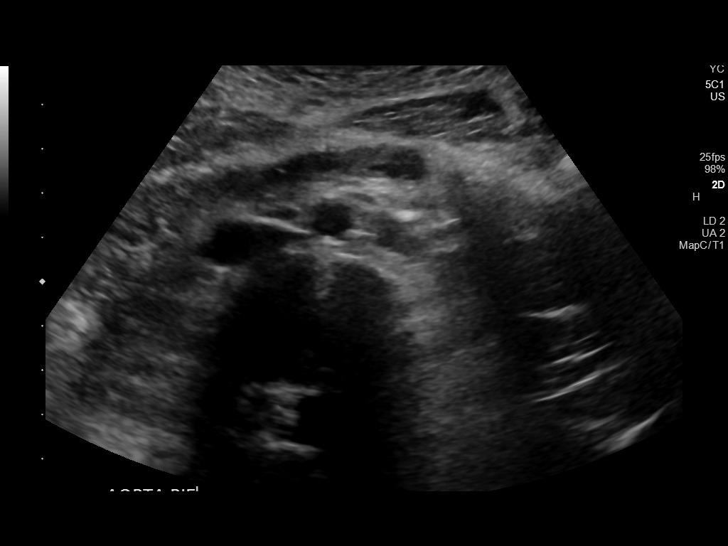

[14 of 25 positions shown; findings below may reference images not displayed]

FINDINGS: Gallbladder: No visible stones. No wall thickening or sonographic
Murphy sign.

Common bile duct: Diameter: Normal caliber, 3 mm.

Liver: No focal lesion identified. Within normal limits in
parenchymal echogenicity. Portal vein is patent on color Doppler
imaging with normal direction of blood flow towards the liver.

IVC: No abnormality visualized.

Pancreas: Visualized portion unremarkable.

Spleen: Size and appearance within normal limits.

Right Kidney: Length: 9.9 cm. Echogenicity within normal limits. No
mass or hydronephrosis visualized.

Left Kidney: Length: 9.2 cm. Echogenicity within normal limits. No
mass or hydronephrosis visualized.

Abdominal aorta: No aneurysm visualized.

Other findings: None.
IMPRESSION: No gallstones.

No acute findings.

## 2021-04-12 IMAGING — US US TRANSVAGINAL NON-OB
1 series · 13 of 25 positions shown · non-contrast
Comparison: CT scan 09/29/2018

CLINICAL DATA: Right-sided pelvic pain for 1 day.



[Series 1: us transvaginal non-ob · 92 acquisitions, 13 frames shown]
[im 1/92]
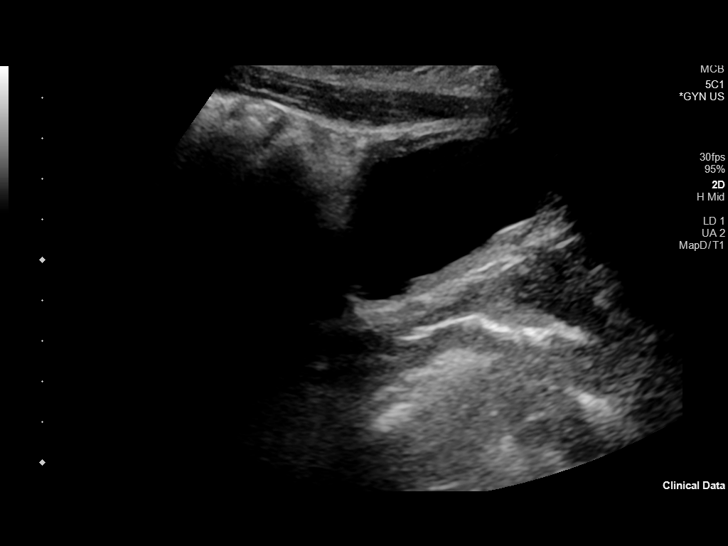
[im 8/92]
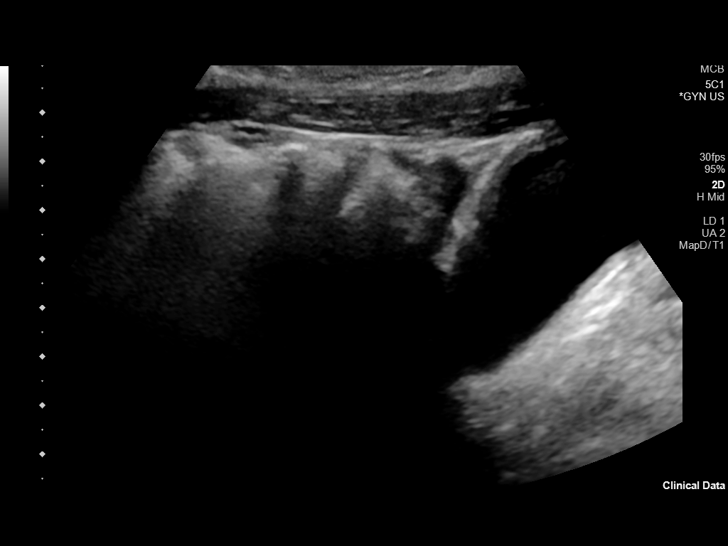
[im 16/92]
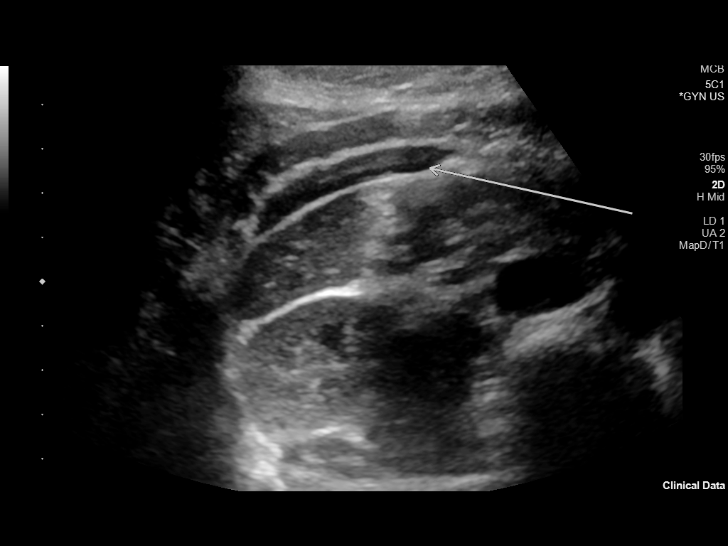
[im 23/92]
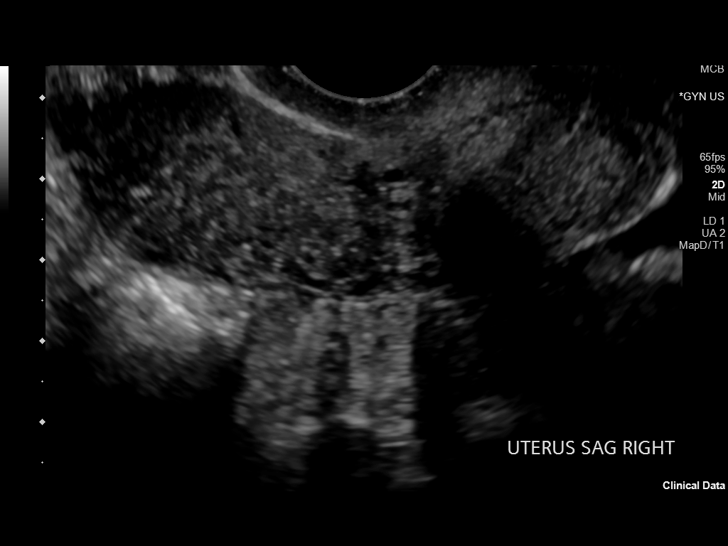
[im 31/92]
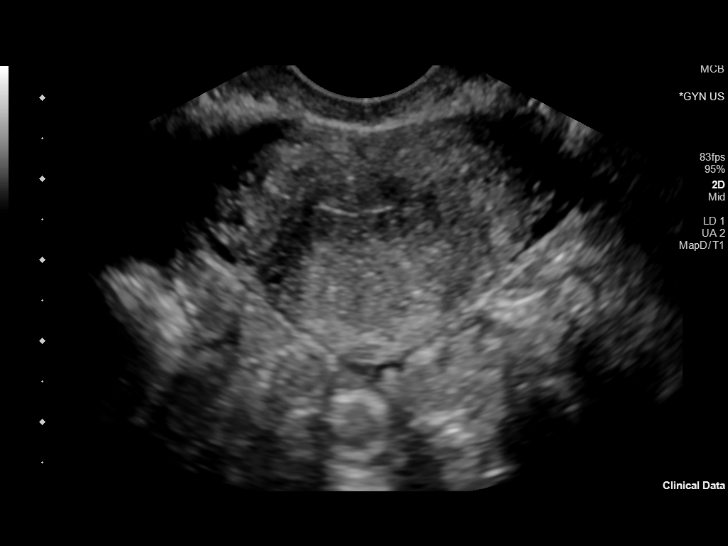
[im 38/92]
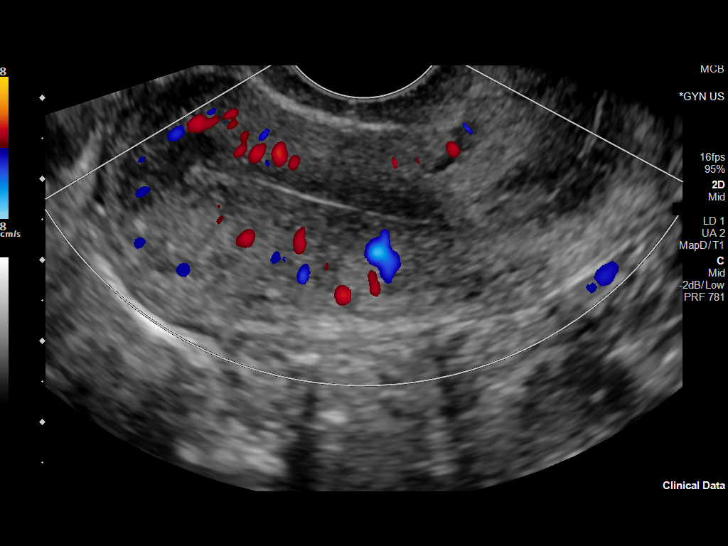
[im 46/92]
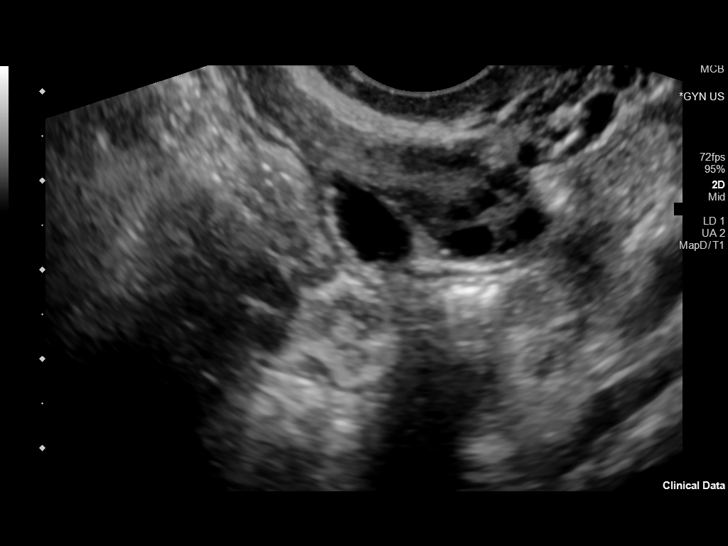
[im 54/92]
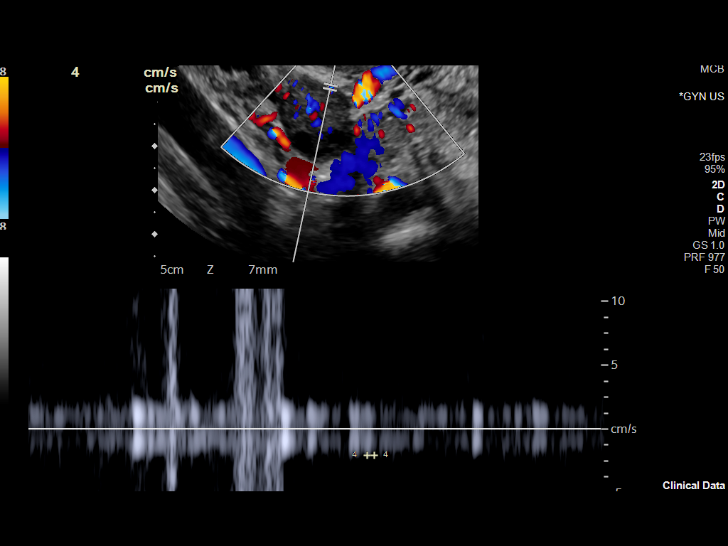
[im 61/92]
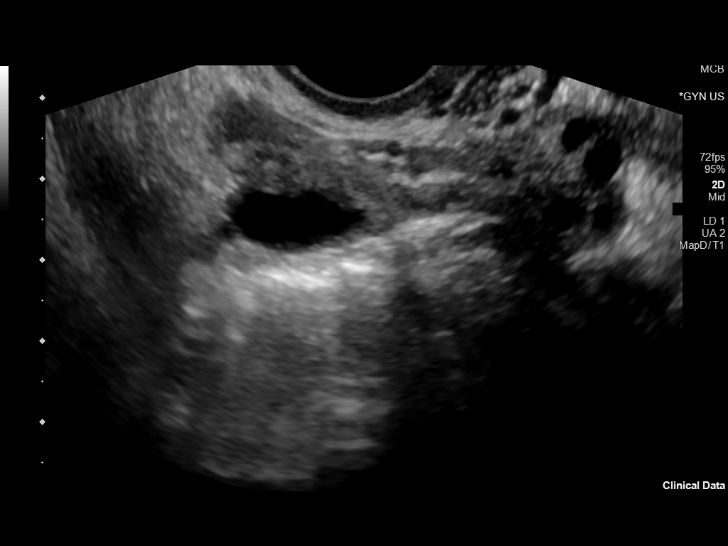
[im 69/92]
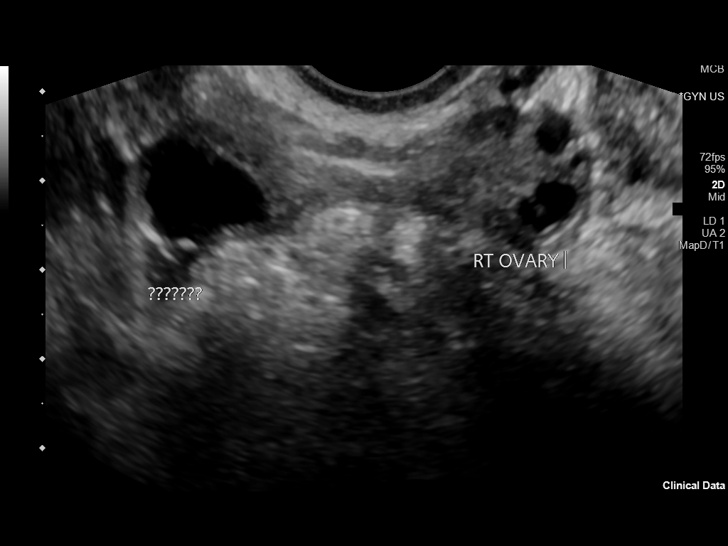
[im 76/92]
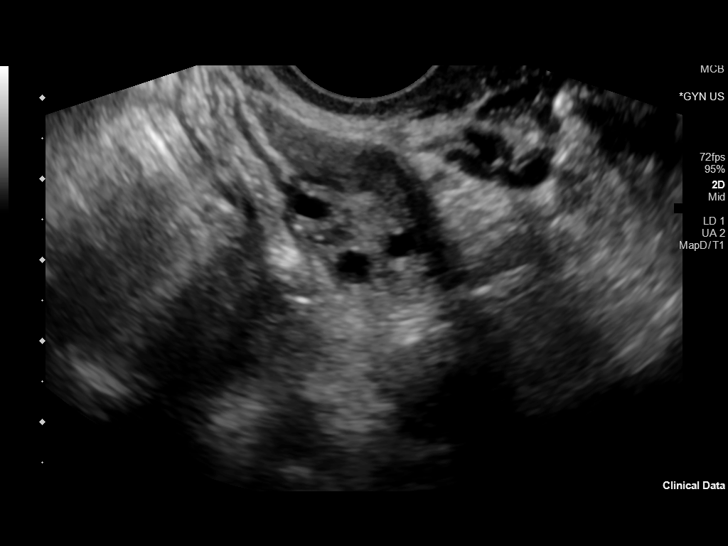
[im 84/92]
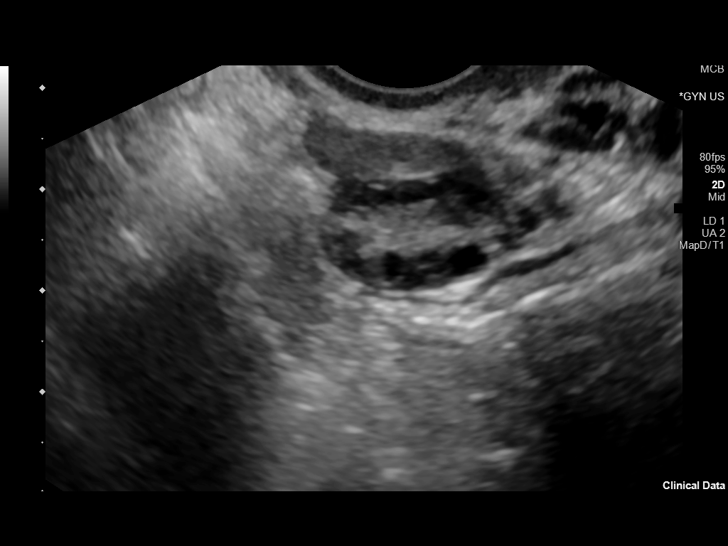
[im 92/92]
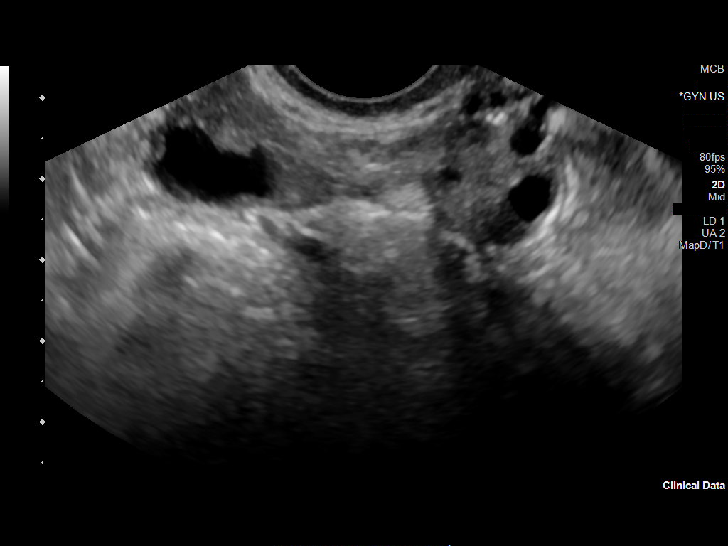

[13 of 25 positions shown; findings below may reference images not displayed]

FINDINGS: Uterus

Measurements: 7.5 x 3.4 x 4.7 cm = volume: 62.2 mL. No myometrial
abnormalities are identified. Small nabothian cyst noted at the
cervix.

Endometrium

Thickness: 2.4 mm.  No focal abnormality visualized.

Right ovary

Measurements: 3.5 x 1.7 x 3.0 cm = volume: 9.1 mL. Normal
appearance/no adnexal mass.

Left ovary

Measurements: 2.2 x 1.1 x 3.0 cm = volume: 3.7 mL. Normal
appearance/no adnexal mass.

Pulsed Doppler evaluation of both ovaries demonstrates normal
low-resistance arterial and venous waveforms.

Other findings

Trace free pelvic fluid in the right lower quadrant near the liver
edge. There is also a small focal area of fluid in the right lower
quadrant/right pelvis superior to the right ovary which measures
x 2.1 x 1.8 cm. This could be a benign peritoneal cyst or
paraovarian cyst. If symptoms persist CT may be helpful for further
evaluation.
IMPRESSION: 1. Normal sonographic appearance of the uterus and both ovaries.
2. Small amount of free pelvic fluid.
3. Small focal fluid collection or cystic structure in the right
pelvis superior to the right ovary could be a paraovarian cyst or
other peritoneal cyst. If symptoms persist CT may be helpful for
further evaluation.

## 2022-09-11 ENCOUNTER — Emergency Department (HOSPITAL_COMMUNITY): Payer: Self-pay

## 2022-09-11 ENCOUNTER — Encounter (HOSPITAL_COMMUNITY): Payer: Self-pay

## 2022-09-11 ENCOUNTER — Other Ambulatory Visit: Payer: Self-pay

## 2022-09-11 ENCOUNTER — Emergency Department (HOSPITAL_COMMUNITY)
Admission: EM | Admit: 2022-09-11 | Discharge: 2022-09-11 | Disposition: A | Discharge status: Home / Self Care | Payer: Self-pay | Attending: Emergency Medicine | Admitting: Emergency Medicine

## 2022-09-11 DIAGNOSIS — N939 Abnormal uterine and vaginal bleeding, unspecified: Secondary | ICD-10-CM | POA: Insufficient documentation

## 2022-09-11 DIAGNOSIS — R509 Fever, unspecified: Secondary | ICD-10-CM | POA: Insufficient documentation

## 2022-09-11 DIAGNOSIS — R112 Nausea with vomiting, unspecified: Secondary | ICD-10-CM | POA: Insufficient documentation

## 2022-09-11 DIAGNOSIS — R1031 Right lower quadrant pain: Secondary | ICD-10-CM | POA: Insufficient documentation

## 2022-09-11 LAB — URINALYSIS, W/ REFLEX TO CULTURE (INFECTION SUSPECTED)
Bilirubin Urine: NEGATIVE
Glucose, UA: NEGATIVE mg/dL
Ketones, ur: 5 mg/dL — AB
Leukocytes,Ua: NEGATIVE
Nitrite: NEGATIVE
Protein, ur: NEGATIVE mg/dL
Specific Gravity, Urine: 1.006 (ref 1.005–1.030)
pH: 8 (ref 5.0–8.0)

## 2022-09-11 LAB — CBC WITH DIFFERENTIAL/PLATELET
Abs Immature Granulocytes: 0.01 10*3/uL (ref 0.00–0.07)
Basophils Absolute: 0 10*3/uL (ref 0.0–0.1)
Basophils Relative: 0 %
Eosinophils Absolute: 0.1 10*3/uL (ref 0.0–0.5)
Eosinophils Relative: 1 %
HCT: 41.7 % (ref 36.0–46.0)
Hemoglobin: 13.4 g/dL (ref 12.0–15.0)
Immature Granulocytes: 0 %
Lymphocytes Relative: 36 %
Lymphs Abs: 2.7 10*3/uL (ref 0.7–4.0)
MCH: 30.8 pg (ref 26.0–34.0)
MCHC: 32.1 g/dL (ref 30.0–36.0)
MCV: 95.9 fL (ref 80.0–100.0)
Monocytes Absolute: 0.7 10*3/uL (ref 0.1–1.0)
Monocytes Relative: 9 %
Neutro Abs: 3.9 10*3/uL (ref 1.7–7.7)
Neutrophils Relative %: 54 %
Platelets: 321 10*3/uL (ref 150–400)
RBC: 4.35 MIL/uL (ref 3.87–5.11)
RDW: 13.3 % (ref 11.5–15.5)
WBC: 7.4 10*3/uL (ref 4.0–10.5)
nRBC: 0 % (ref 0.0–0.2)

## 2022-09-11 LAB — LACTIC ACID, PLASMA: Lactic Acid, Venous: 1.6 mmol/L (ref 0.5–1.9)

## 2022-09-11 LAB — COMPREHENSIVE METABOLIC PANEL
ALT: 17 U/L (ref 0–44)
AST: 17 U/L (ref 15–41)
Albumin: 4.6 g/dL (ref 3.5–5.0)
Alkaline Phosphatase: 41 U/L (ref 38–126)
Anion gap: 11 (ref 5–15)
BUN: 12 mg/dL (ref 6–20)
CO2: 22 mmol/L (ref 22–32)
Calcium: 9.2 mg/dL (ref 8.9–10.3)
Chloride: 106 mmol/L (ref 98–111)
Creatinine, Ser: 0.73 mg/dL (ref 0.44–1.00)
GFR, Estimated: >60 mL/min (ref >60)
Glucose, Bld: 100 mg/dL — ABNORMAL HIGH (ref 70–99)
Potassium: 3.6 mmol/L (ref 3.5–5.1)
Sodium: 139 mmol/L (ref 135–145)
Total Bilirubin: 0.9 mg/dL (ref 0.3–1.2)
Total Protein: 7.9 g/dL (ref 6.5–8.1)

## 2022-09-11 LAB — PREGNANCY, URINE: Preg Test, Ur: NEGATIVE

## 2022-09-11 LAB — LIPASE, BLOOD: Lipase: 41 U/L (ref 11–51)

## 2022-09-11 MED ORDER — FAMOTIDINE IN NACL 20-0.9 MG/50ML-% IV SOLN
20.0000 mg | Freq: Once | INTRAVENOUS | Status: AC
  Administered 2022-09-11: 20 mg via INTRAVENOUS
  Filled 2022-09-11: qty 50

## 2022-09-11 MED ORDER — SODIUM CHLORIDE (PF) 0.9 % IJ SOLN
INTRAMUSCULAR | Status: AC
  Filled 2022-09-11: qty 50

## 2022-09-11 MED ORDER — SODIUM CHLORIDE 0.9 % IV BOLUS
1000.0000 mL | Freq: Once | INTRAVENOUS | Status: AC
  Administered 2022-09-11: 1000 mL via INTRAVENOUS

## 2022-09-11 MED ORDER — HYDROMORPHONE HCL 1 MG/ML IJ SOLN
1.0000 mg | Freq: Once | INTRAMUSCULAR | Status: AC
  Administered 2022-09-11: 1 mg via INTRAVENOUS
  Filled 2022-09-11: qty 1

## 2022-09-11 MED ORDER — DIPHENHYDRAMINE HCL 50 MG/ML IJ SOLN
25.0000 mg | Freq: Once | INTRAMUSCULAR | Status: AC
  Administered 2022-09-11: 25 mg via INTRAVENOUS
  Filled 2022-09-11: qty 1

## 2022-09-11 MED ORDER — MORPHINE SULFATE (PF) 4 MG/ML IV SOLN
4.0000 mg | Freq: Once | INTRAVENOUS | Status: AC
  Administered 2022-09-11: 4 mg via INTRAVENOUS
  Filled 2022-09-11: qty 1

## 2022-09-11 MED ORDER — IOHEXOL 300 MG/ML  SOLN
100.0000 mL | Freq: Once | INTRAMUSCULAR | Status: AC | PRN
  Administered 2022-09-11: 100 mL via INTRAVENOUS

## 2022-09-11 MED ORDER — ONDANSETRON HCL 4 MG/2ML IJ SOLN
4.0000 mg | Freq: Once | INTRAMUSCULAR | Status: AC
  Administered 2022-09-11: 4 mg via INTRAVENOUS
  Filled 2022-09-11: qty 2

## 2022-09-11 MED ORDER — METHYLPREDNISOLONE SODIUM SUCC 125 MG IJ SOLR
125.0000 mg | Freq: Once | INTRAMUSCULAR | Status: AC
  Administered 2022-09-11: 125 mg via INTRAVENOUS
  Filled 2022-09-11: qty 2

## 2022-09-11 MED ORDER — KETOROLAC TROMETHAMINE 15 MG/ML IJ SOLN
15.0000 mg | Freq: Once | INTRAMUSCULAR | Status: AC
  Administered 2022-09-11: 15 mg via INTRAVENOUS
  Filled 2022-09-11: qty 1

## 2022-09-11 NOTE — ED Provider Notes (Signed)
Patient care was taken over from Dr. Julieanne Manson.  She had presented with some right side lower abdominal pain.  She had a CT scan did not show any acute abnormality.  Pelvic ultrasound was negative for torsion or other abnormality.  She had previously refused pelvic exam.  She is feeling much better on reevaluation.  She reportedly had some sort of reaction to the Dilaudid.  She has been observed here for about 2 hours and feels much better.  Her pain has markedly improved.  Repeat abdominal exam is nonconcerning.  Doubt occult torsion.  She does say that she has had a prior ovarian cyst rupture that felt similar to this.  She was discharged home in good condition.  She was encouraged to have close follow-up with her primary care doctor.  She was also given information about following up with the women's outpatient clinic if this seems to be a better option for her.  Return precautions were given.   Rolan Bucco, MD 09/11/22 [phone]

## 2022-09-11 NOTE — ED Provider Notes (Signed)
[facility]rist[facility] Harmon EMERGENCY DEPARTMENT AT [facility] Provider Note   CSN: [id number] Arrival date & time: 09/11/22  8413     History  Chief Complaint  Patient presents with   Abdominal Pain    Kristi Harmon is a 27 y.o. female.  The history is provided by the patient and medical records. No language interpreter was used.  Abdominal Pain Pain location:  R flank and RLQ Pain quality: aching and sharp   Pain radiates to:  Does not radiate Pain severity:  Severe Onset quality:  Sudden Duration:  5 hours Timing:  Constant Progression:  Worsening Chronicity:  New Context: not previous surgeries and not trauma   Relieved by:  Nothing Worsened by:  Nothing Ineffective treatments:  None tried Associated symptoms: fever (subjectve fever), nausea, vaginal bleeding (on menstrual cycle) and vomiting   Associated symptoms: no chest pain, no chills, no constipation, no cough, no diarrhea, no dysuria, no fatigue, no shortness of breath and no vaginal discharge        Home Medications Prior to Admission medications   Medication Sig Start Date End Date Taking? Authorizing Provider  cephALEXin (KEFLEX) 500 MG capsule Take 1 capsule (500 mg total) by mouth 2 (two) times daily. 03/29/21   Margaretann Loveless, PA-C  clotrimazole (LOTRIMIN) 1 % cream Apply 1 application topically 2 (two) times daily. 01/29/21   Couture, Cortni S, PA-C  diphenhydramine-acetaminophen (TYLENOL PM) 25-500 MG TABS tablet Take 2 tablets by mouth at bedtime as needed (headache/pain/sleep).    [provider]  polyvinyl alcohol (LIQUIFILM TEARS) 1.4 % ophthalmic solution Place 1 drop into both eyes as needed for dry eyes.    [provider]  promethazine (PHENERGAN) 25 MG tablet Take 1 tablet (25 mg total) by mouth every 6 (six) hours as needed for nausea or vomiting. Patient not taking: Reported on 05/12/2017 02/25/16 09/12/18  Ward, Layla Maw, DO      Allergies    Prozac [fluoxetine hcl]     Review of Systems   Review of Systems  Constitutional:  Positive for fever (subjectve fever). Negative for chills and fatigue.  HENT:  Negative for congestion.   Respiratory:  Negative for cough and shortness of breath.   Cardiovascular:  Negative for chest pain.  Gastrointestinal:  Positive for abdominal pain, nausea and vomiting. Negative for constipation and diarrhea.  Genitourinary:  Positive for flank pain and vaginal bleeding (on menstrual cycle). Negative for dysuria and vaginal discharge.       Urine darker per pt  Musculoskeletal:  Positive for back pain. Negative for neck pain and neck stiffness.  Skin:  Negative for rash and wound.  Neurological:  Negative for headaches.  Psychiatric/Behavioral:  Negative for agitation.   All other systems reviewed and are negative.   Physical Exam Updated Vital Signs BP 123/81 (BP Location: Left Arm)   Pulse (!) 114   Temp 98.4 F (36.9 C) (Oral)   Resp (!) 21   Ht 5\' 2"  (1.575 m)   Wt 49.9 kg   SpO2 99%   BMI 20.12 kg/m  Physical Exam Vitals and nursing note reviewed.  Constitutional:      General: She is not in acute distress.    Appearance: She is well-developed. She is not ill-appearing, toxic-appearing or diaphoretic.  HENT:     Head: Normocephalic and atraumatic.  Eyes:     Conjunctiva/sclera: Conjunctivae normal.  Cardiovascular:     Rate and Rhythm: Normal rate and regular rhythm.  Heart sounds: No murmur heard. Pulmonary:     Effort: Pulmonary effort is normal. No respiratory distress.     Breath sounds: Normal breath sounds. No wheezing or rhonchi.  Chest:     Chest wall: No tenderness.  Abdominal:     General: Bowel sounds are normal.     Palpations: Abdomen is soft.     Tenderness: There is abdominal tenderness in the right upper quadrant, right lower quadrant and periumbilical area. There is right CVA tenderness.  Musculoskeletal:        General: No swelling.     Cervical back: Neck supple.  Skin:     General: Skin is warm and dry.     Capillary Refill: Capillary refill takes less than 2 seconds.  Neurological:     Mental Status: She is alert.  Psychiatric:        Mood and Affect: Mood normal.     ED Results / Procedures / Treatments   Labs (all labs ordered are listed, but only abnormal results are displayed) Labs Reviewed  URINALYSIS, W/ REFLEX TO CULTURE (INFECTION SUSPECTED) - Abnormal; Notable for the following components:      Result Value   Color, Urine STRAW (*)    Hgb urine dipstick MODERATE (*)    Ketones, ur 5 (*)    Bacteria, UA RARE (*)    All other components within normal limits  COMPREHENSIVE METABOLIC PANEL - Abnormal; Notable for the following components:   Glucose, Bld 100 (*)    All other components within normal limits  PREGNANCY, URINE  CBC WITH DIFFERENTIAL/PLATELET  LACTIC ACID, PLASMA  LIPASE, BLOOD  LACTIC ACID, PLASMA    EKG None  Radiology CT ABDOMEN PELVIS W CONTRAST  Result Date: 09/11/2022 CLINICAL DATA:  Right lower quadrant abdominal pain. EXAM: CT ABDOMEN AND PELVIS WITH CONTRAST TECHNIQUE: Multidetector CT imaging of the abdomen and pelvis was performed using the standard protocol following bolus administration of intravenous contrast. RADIATION DOSE REDUCTION: This exam was performed according to the departmental dose-optimization program which includes automated exposure control, adjustment of the mA and/or kV according to patient size and/or use of iterative reconstruction technique. CONTRAST:  OMNIPAQUE IOHEXOL 300 MG/ML  SOLN COMPARISON:  CT 01/17/2019 and ultrasound.  Older exams as well. FINDINGS: Lower chest: Lung bases are clear.  No pleural effusion. Hepatobiliary: Mild fatty liver infiltration. Patent portal vein. Gallbladder is nondilated. Pancreas: Unremarkable. No pancreatic ductal dilatation or surrounding inflammatory changes. Spleen: Normal in size without focal abnormality. Adrenals/Urinary Tract: Adrenal glands are  unremarkable. Kidneys are normal, without renal calculi, focal lesion, or hydronephrosis. Bladder is unremarkable. Stomach/Bowel: Stomach is mildly distended with fluid. On this non oral contrast exam the stomach, small bowel and large bowel are nondilated with scattered colonic stool. The appendix is difficult to follow but no specific pericecal stranding or fluid. Vascular/Lymphatic: No significant vascular findings are present. No enlarged abdominal or pelvic lymph nodes. Reproductive: Uterus and bilateral adnexa are unremarkable. Presumed tampon along the vagina. Other: Small amount of free fluid in the pelvis which is nonspecific. Could be physiologic. Musculoskeletal: Partial sacralization of L5 with some areas of sclerosis. This can be a source of discomfort. IMPRESSION: No bowel obstruction or free air. Scattered stool. Trace free fluid in the pelvis could be physiologic. Electronically Signed   By: Karen Kays M.D.   On: 09/11/2022 13:46    Procedures Procedures    Medications Ordered in ED Medications  sodium chloride 0.9 % bolus 1,000 mL (0  mLs Intravenous Stopped 09/11/22 1150)  morphine (PF) 4 MG/ML injection 4 mg (4 mg Intravenous Given 09/11/22 1004)  ondansetron (ZOFRAN) injection 4 mg (4 mg Intravenous Given 09/11/22 1004)  morphine (PF) 4 MG/ML injection 4 mg (4 mg Intravenous Given 09/11/22 1114)  HYDROmorphone (DILAUDID) injection 1 mg (1 mg Intravenous Given 09/11/22 1212)  iohexol (OMNIPAQUE) 300 MG/ML solution 100 mL (100 mLs Intravenous Contrast Given 09/11/22 1256)  ondansetron (ZOFRAN) injection 4 mg (4 mg Intravenous Given 09/11/22 1353)  diphenhydrAMINE (BENADRYL) injection 25 mg (25 mg Intravenous Given 09/11/22 1428)  methylPREDNISolone sodium succinate (SOLU-MEDROL) 125 mg/2 mL injection 125 mg (125 mg Intravenous Given 09/11/22 1428)  famotidine (PEPCID) IVPB 20 mg premix (0 mg Intravenous Stopped 09/11/22 1458)  sodium chloride 0.9 % bolus 1,000 mL (1,000 mLs Intravenous New  Bag/Given 09/11/22 1428)  ketorolac (TORADOL) 15 MG/ML injection 15 mg (15 mg Intravenous Given 09/11/22 1427)    ED Course/ Medical Decision Making/ A&P                             Medical Decision Making Amount and/or Complexity of Data Reviewed Labs: ordered. Radiology: ordered.  Risk Prescription drug management.    Kristi Harmon is a 27 y.o. female with past medical history significant previous pancreatitis who presents with right-sided abdominal pain.  According to patient, she was feeling fatigued yesterday but did not have any discomfort but woke up this morning with severe right lower quadrant abdominal pain.  Goes to her left flank and towards her left back but she has no history of kidney stones.  She denies any lower pelvic pain but reports the pain is all over her abdomen but worse on the right lower abdomen.  She reports this feels different than her previous pancreatitis and location but pain is up to 10 out of 10 in severity.  She has had nausea vomiting and dry heaving.  She reports she is on her menstrual cycle.  She reports her urine is slightly darker but denies any trauma.  No rash to suggest shingles.  She denies a history of ovarian torsion or ovarian problems and denies any vaginal discharge.  She denies other complaints on arrival.  No constipation, diarrhea, and no other URI symptoms with cough congestion chest pain or shortness of breath.  On exam, patient does have significant tenderness in her right lower quadrant and right flank.  Right CVA is also slightly tender but is more tender on the right abdomen.  Bowel sounds are appreciated.  Left flank nontender.  Lungs clear chest nontender.  Patient very uncomfortable initially.  Clinically most concerned about appendicitis versus kidney stone and less likely felt to be a pelvic etiology.  If CT scan is completely reassuring and there is no evidence of abnormality, we will have a discussion about possible pelvic exam and  pelvic ultrasound to rule out torsion however this felt to be less likely initially based on her description of symptoms and exam.  Will get screening labs and give her some pain medicine and nausea medicine as well.  Anticipate reassessment after workup and imaging.  2:14 PM Workup continues to return.  Patient's urinalysis did not show evidence of UTI with no nitrites or leukocytes.  She does have some blood in the urine but she is on her menstrual cycle.  She is not pregnant.  CBC and CMP reassuring and lipase not elevated.  No recurrent pancreatitis.  Lactic acid  normal.  Patient reassessed and is miserable with continued severe right lower abdominal pain with nausea and vomiting.  CT scan was obtained and did not show definitive cause.  No evidence of appendix inflammation or large kidney stone.  No clear cause of symptoms.  Ovaries and adnexa did not look impressive or abnormal however as we discussed with patient, given her lack of clear cause, we will continue a pelvic workup.  We had a shared decision-making conversation and patient does not want a pelvic [place] swabs at this time but agrees to getting a pelvic ultrasound.  Due to continued pain, Dilaudid was given but shortly thereafter patient started having worsened nausea vomiting, feeling short of breath, and feeling very lightheaded.  She was diaphoretic and very flushed.  Concern for possible allergic reaction.  Will give antihistamines, steroids, and as the patient had just received Zofran, will get EKG to look at QTc.  Will also give her some Ativan to help her relax and also treat the nausea without prolonging QTc.  If patient needs further nausea medicine, would consider Phenergan if it has been a while since the Zofran.  Will give Toradol given her lack of pregnancy and normal renal function.  Still unclear etiology of symptoms the patient still appears miserable with severe right-sided abdominal pain.  Will get pelvic ultrasound  and give her more medications.  Will give fluids for her lightheadedness and get her back on the monitor.  Anticipate reassessment after workup.  Care transferred to oncoming team to wait for results of pelvic ultrasound and reassessment.  Family does report that she has a family history of ruptured ovarian cysts so this could be the etiology.  Will wait for results of ultrasound and reassess.  If ultrasound reassuring, anticipate discharge with medication to help with symptoms and outpatient follow-up.         Final Clinical Impression(s) / ED Diagnoses Final diagnoses:  RLQ abdominal pain   Clinical Impression: 1. RLQ abdominal pain     Disposition: Care transferred to oncoming team to await results of pelvic ultrasound and reassessment.  This note was prepared with assistance of Conservation officer, historic buildings. Occasional wrong-word or sound-a-like substitutions may have occurred due to the inherent limitations of voice recognition software.      Shannia Jacuinde, Canary Brim, MD 09/11/22 [phone]

## 2022-09-11 NOTE — ED Notes (Signed)
Tried to take temp because she keeps vomiting

## 2022-09-11 NOTE — ED Triage Notes (Signed)
Pt arrived POV sudden onset of RLQ pain and nausea this morning. States she also had a fever and Took tylenol. AAOX4. MAE, resp even and unlabored. Denies any other symptoms at this time

## 2022-09-11 NOTE — Discharge Instructions (Addendum)
Follow-up with your primary care doctor within the next couple of days or the [facility] listed above as needed if your symptoms are ongoing.  Return to the emergency room if you have any worsening symptoms.

## 2022-11-13 ENCOUNTER — Emergency Department (HOSPITAL_COMMUNITY): Payer: Self-pay

## 2022-11-13 ENCOUNTER — Other Ambulatory Visit: Payer: Self-pay

## 2022-11-13 ENCOUNTER — Encounter (HOSPITAL_COMMUNITY): Payer: Self-pay

## 2022-11-13 ENCOUNTER — Emergency Department (HOSPITAL_COMMUNITY)
Admission: EM | Admit: 2022-11-13 | Discharge: 2022-11-13 | Disposition: A | Discharge status: Home / Self Care | Payer: Self-pay | Attending: Emergency Medicine | Admitting: Emergency Medicine

## 2022-11-13 DIAGNOSIS — R590 Localized enlarged lymph nodes: Secondary | ICD-10-CM | POA: Insufficient documentation

## 2022-11-13 DIAGNOSIS — R000 Tachycardia, unspecified: Secondary | ICD-10-CM | POA: Insufficient documentation

## 2022-11-13 DIAGNOSIS — R591 Generalized enlarged lymph nodes: Secondary | ICD-10-CM

## 2022-11-13 LAB — CBC WITH DIFFERENTIAL/PLATELET
Abs Immature Granulocytes: 0.03 10*3/uL (ref 0.00–0.07)
Basophils Absolute: 0 10*3/uL (ref 0.0–0.1)
Basophils Relative: 0 %
Eosinophils Absolute: 0.1 10*3/uL (ref 0.0–0.5)
Eosinophils Relative: 1 %
HCT: 36.4 % (ref 36.0–46.0)
Hemoglobin: 12 g/dL (ref 12.0–15.0)
Immature Granulocytes: 0 %
Lymphocytes Relative: 33 %
Lymphs Abs: 2.7 10*3/uL (ref 0.7–4.0)
MCH: 30.9 pg (ref 26.0–34.0)
MCHC: 33 g/dL (ref 30.0–36.0)
MCV: 93.8 fL (ref 80.0–100.0)
Monocytes Absolute: 0.9 10*3/uL (ref 0.1–1.0)
Monocytes Relative: 11 %
Neutro Abs: 4.5 10*3/uL (ref 1.7–7.7)
Neutrophils Relative %: 55 %
Platelets: 239 10*3/uL (ref 150–400)
RBC: 3.88 MIL/uL (ref 3.87–5.11)
RDW: 12.9 % (ref 11.5–15.5)
WBC: 8.1 10*3/uL (ref 4.0–10.5)
nRBC: 0 % (ref 0.0–0.2)

## 2022-11-13 LAB — BASIC METABOLIC PANEL
Anion gap: 9 (ref 5–15)
BUN: 10 mg/dL (ref 6–20)
CO2: 26 mmol/L (ref 22–32)
Calcium: 9.1 mg/dL (ref 8.9–10.3)
Chloride: 104 mmol/L (ref 98–111)
Creatinine, Ser: 0.74 mg/dL (ref 0.44–1.00)
GFR, Estimated: >60 mL/min (ref >60)
Glucose, Bld: 115 mg/dL — ABNORMAL HIGH (ref 70–99)
Potassium: 3.8 mmol/L (ref 3.5–5.1)
Sodium: 139 mmol/L (ref 135–145)

## 2022-11-13 MED ORDER — IOHEXOL 350 MG/ML SOLN
75.0000 mL | Freq: Once | INTRAVENOUS | Status: AC | PRN
  Administered 2022-11-13: 75 mL via INTRAVENOUS

## 2022-11-13 NOTE — ED Triage Notes (Signed)
Patient said she has noticed a lump in her left armpit since Wednesday. Feels like the size of a golf ball. Painful to move her arm.

## 2022-11-13 NOTE — ED Provider Notes (Signed)
[place] EMERGENCY DEPARTMENT AT [facility] Provider Note   CSN: [id number] Arrival date & time: 11/13/22  1304     History  Chief Complaint  Patient presents with   Abscess    Kristi Harmon is a 27 y.o. female.  27 year old female presents today for concern of "lump" in the left axillary region.  Present for about 8 days.  No prior history of abscess in the site.  She states she has been trying warm compress, cold compress, as well as NSAIDs without improvement.  She states it has not decreased in size.  Feels like a golf ball.  It is painful.  No fever.  Tolerating p.o. intake without difficulty.  No recent illness.  She does state over the past couple days she has been short of breath particularly with exertion.  Denies any recent long travel, recent surgery.  States she had myalgias in both of her legs but no unilateral pain or swelling in the lower extremities.  No prior history of blood clots.  No history of malignancy.  The history is provided by the patient. No language interpreter was used.       Home Medications Prior to Admission medications   Medication Sig Start Date End Date Taking? Authorizing Provider  cephALEXin (KEFLEX) 500 MG capsule Take 1 capsule (500 mg total) by mouth 2 (two) times daily. 03/29/21   Margaretann Loveless, PA-C  clotrimazole (LOTRIMIN) 1 % cream Apply 1 application topically 2 (two) times daily. 01/29/21   Couture, Cortni S, PA-C  diphenhydramine-acetaminophen (TYLENOL PM) 25-500 MG TABS tablet Take 2 tablets by mouth at bedtime as needed (headache/pain/sleep).    [provider]  polyvinyl alcohol (LIQUIFILM TEARS) 1.4 % ophthalmic solution Place 1 drop into both eyes as needed for dry eyes.    [provider]  promethazine (PHENERGAN) 25 MG tablet Take 1 tablet (25 mg total) by mouth every 6 (six) hours as needed for nausea or vomiting. Patient not taking: Reported on 05/12/2017 02/25/16 09/12/18  Ward, Layla Maw, DO       Allergies    Prozac [fluoxetine hcl]    Review of Systems   Review of Systems  Constitutional:  Negative for appetite change, chills and fever.  Respiratory:  Negative for shortness of breath.   Cardiovascular:  Negative for chest pain.  Gastrointestinal:  Negative for abdominal pain and nausea.  Neurological:  Negative for light-headedness.  All other systems reviewed and are negative.   Physical Exam Updated Vital Signs BP (!) 144/90   Pulse (!) 115   Temp 99.5 F (37.5 C) (Oral)   Resp 18   Ht 5\' 3"  (1.6 m)   Wt 50.3 kg   SpO2 100%   BMI 19.66 kg/m  Physical Exam Vitals and nursing note reviewed.  Constitutional:      General: She is not in acute distress.    Appearance: Normal appearance. She is not ill-appearing.  HENT:     Head: Normocephalic and atraumatic.     Nose: Nose normal.  Eyes:     General: No scleral icterus.    Extraocular Movements: Extraocular movements intact.     Conjunctiva/sclera: Conjunctivae normal.  Cardiovascular:     Rate and Rhythm: Regular rhythm. Tachycardia present.     Heart sounds: Normal heart sounds.  Pulmonary:     Effort: Pulmonary effort is normal. No respiratory distress.     Breath sounds: Normal breath sounds. No wheezing or rales.  Abdominal:  General: There is no distension.     Tenderness: There is no abdominal tenderness.  Musculoskeletal:        General: Normal range of motion.     Cervical back: Normal range of motion.  Skin:    General: Skin is warm and dry.     Comments: Swelling noted in the left axilla.  It is mobile and tender.  No warmth, or erythema over this site.  Neurological:     General: No focal deficit present.     Mental Status: She is alert. Mental status is at baseline.     ED Results / Procedures / Treatments   Labs (all labs ordered are listed, but only abnormal results are displayed) Labs Reviewed  CBC WITH DIFFERENTIAL/PLATELET  BASIC METABOLIC PANEL     EKG None  Radiology No results found.  Procedures Procedures    Medications Ordered in ED Medications - No data to display  ED Course/ Medical Decision Making/ A&P                                 Medical Decision Making Amount and/or Complexity of Data Reviewed Labs: ordered. Radiology: ordered.  Risk Prescription drug management.   27 year old female presents today for concern of swelling within the left maxillary region.  Present for about 8 days.  Describes it as a size of a "golf ball".  Has not changed in size.  No fever.  No overlying erythema, or warmth.  No fluctuance.  Has tried warm compress, cold compress, and NSAIDs without improvement.  Also endorses dyspnea over the last couple days worse with exertion.  No history of PE.  Since the plan was to further evaluate the area of swelling with imaging given the shortness of breath we will obtain PE study.   Blood work is reassuring with CBC without leukocytosis or anemia.  BMP with glucose 115 otherwise without acute concern.  CT angio without evidence of PE.  Does show adenopathy.  Will refer to hematology/oncology.  Discussed with attending who agreed.  Patient discharged in stable condition.  She voices understanding and is in agreement with plan.   Final Clinical Impression(s) / ED Diagnoses Final diagnoses:  Lymphadenopathy    Rx / DC Orders ED Discharge Orders          Ordered    Ambulatory referral to Hematology / Oncology       Comments: Your emergency department provider has referred you to see a hematology/oncology specialist. These are physicians who specialize in blood disorders and cancers, or findings concerning for cancer. You will receive a phone call from the [facility] Office to set up your appointment within 2 business days: [employer] operate Mon - Fri, 8:00 a.m. to 5:00 p.m.; closed for federally recognized holidays. Please be sure your phone is not set to block numbers during this  time.   11/13/22 1742              Marita [place], PA-C 11/13/22 1746    Lonell Grandchild, MD 11/14/22 [phone]

## 2022-11-13 NOTE — Discharge Instructions (Signed)
Your workup today showed that you have some lymphadenopathy within the left axillary region.  I have given you a referral to hematology/oncology.  They should call you to schedule this appointment within the next couple business days.  However the information is listed for you in case you want to call them tomorrow morning to set this up.  Please ensure you do follow-up.  For any concerning symptoms return to the emergency room.  You can take Tylenol 1000 mg every 6-8 hours along with ibuprofen 600 mg every 6-8 hours for pain control.

## 2022-11-13 NOTE — ED Notes (Signed)
Pt transported to CT via stretcher.  

## 2022-11-18 ENCOUNTER — Inpatient Hospital Stay: Payer: Self-pay | Attending: Physician Assistant

## 2022-11-18 ENCOUNTER — Inpatient Hospital Stay (HOSPITAL_BASED_OUTPATIENT_CLINIC_OR_DEPARTMENT_OTHER): Payer: Self-pay | Admitting: Physician Assistant

## 2022-11-18 VITALS — BP 109/80 | HR 96 | Temp 98.5°F | Resp 16 | Wt 117.0 lb

## 2022-11-18 DIAGNOSIS — Z801 Family history of malignant neoplasm of trachea, bronchus and lung: Secondary | ICD-10-CM

## 2022-11-18 DIAGNOSIS — R591 Generalized enlarged lymph nodes: Secondary | ICD-10-CM

## 2022-11-18 DIAGNOSIS — N644 Mastodynia: Secondary | ICD-10-CM

## 2022-11-18 DIAGNOSIS — R21 Rash and other nonspecific skin eruption: Secondary | ICD-10-CM

## 2022-11-18 DIAGNOSIS — Z87891 Personal history of nicotine dependence: Secondary | ICD-10-CM | POA: Insufficient documentation

## 2022-11-18 DIAGNOSIS — Z808 Family history of malignant neoplasm of other organs or systems: Secondary | ICD-10-CM | POA: Insufficient documentation

## 2022-11-18 DIAGNOSIS — F1290 Cannabis use, unspecified, uncomplicated: Secondary | ICD-10-CM

## 2022-11-18 DIAGNOSIS — Z800 Family history of malignant neoplasm of digestive organs: Secondary | ICD-10-CM | POA: Insufficient documentation

## 2022-11-18 LAB — CMP (CANCER CENTER ONLY)
ALT: 10 U/L (ref 0–44)
AST: 14 U/L — ABNORMAL LOW (ref 15–41)
Albumin: 4.7 g/dL (ref 3.5–5.0)
Alkaline Phosphatase: 52 U/L (ref 38–126)
Anion gap: 9 (ref 5–15)
BUN: 11 mg/dL (ref 6–20)
CO2: 25 mmol/L (ref 22–32)
Calcium: 9.6 mg/dL (ref 8.9–10.3)
Chloride: 103 mmol/L (ref 98–111)
Creatinine: 0.69 mg/dL (ref 0.44–1.00)
GFR, Estimated: >60 mL/min (ref >60)
Glucose, Bld: 94 mg/dL (ref 70–99)
Potassium: 3.9 mmol/L (ref 3.5–5.1)
Sodium: 137 mmol/L (ref 135–145)
Total Bilirubin: 0.4 mg/dL (ref 0.3–1.2)
Total Protein: 8.1 g/dL (ref 6.5–8.1)

## 2022-11-18 LAB — CBC WITH DIFFERENTIAL (CANCER CENTER ONLY)
Abs Immature Granulocytes: 0.02 10*3/uL (ref 0.00–0.07)
Basophils Absolute: 0 10*3/uL (ref 0.0–0.1)
Basophils Relative: 0 %
Eosinophils Absolute: 0 10*3/uL (ref 0.0–0.5)
Eosinophils Relative: 0 %
HCT: 41 % (ref 36.0–46.0)
Hemoglobin: 13.7 g/dL (ref 12.0–15.0)
Immature Granulocytes: 0 %
Lymphocytes Relative: 25 %
Lymphs Abs: 2.4 10*3/uL (ref 0.7–4.0)
MCH: 30.9 pg (ref 26.0–34.0)
MCHC: 33.4 g/dL (ref 30.0–36.0)
MCV: 92.6 fL (ref 80.0–100.0)
Monocytes Absolute: 0.6 10*3/uL (ref 0.1–1.0)
Monocytes Relative: 6 %
Neutro Abs: 6.6 10*3/uL (ref 1.7–7.7)
Neutrophils Relative %: 69 %
Platelet Count: 308 10*3/uL (ref 150–400)
RBC: 4.43 MIL/uL (ref 3.87–5.11)
RDW: 12.9 % (ref 11.5–15.5)
WBC Count: 9.6 10*3/uL (ref 4.0–10.5)
nRBC: 0 % (ref 0.0–0.2)

## 2022-11-18 LAB — HEPATITIS B SURFACE ANTIGEN: Hepatitis B Surface Ag: NONREACTIVE

## 2022-11-18 LAB — LACTATE DEHYDROGENASE: LDH: 156 U/L (ref 98–192)

## 2022-11-18 LAB — HEPATITIS C ANTIBODY: HCV Ab: NONREACTIVE

## 2022-11-18 LAB — HEPATITIS B SURFACE ANTIBODY,QUALITATIVE: Hep B S Ab: NONREACTIVE

## 2022-11-18 LAB — HEPATITIS B CORE ANTIBODY, TOTAL: Hep B Core Total Ab: NONREACTIVE

## 2022-11-18 LAB — HIV ANTIBODY (ROUTINE TESTING W REFLEX): HIV Screen 4th Generation wRfx: NONREACTIVE

## 2022-11-18 LAB — HCG, QUANTITATIVE, PREGNANCY: hCG, Beta Chain, Quant, S: 1 m[IU]/mL (ref <5)

## 2022-11-18 NOTE — Patient Instructions (Signed)
Diagnostic Clinic Office Visit Discharge Information and Instructions  Thank you for choosing [facility] for your healthcare needs.  Below is a summary of today's discussion, along with our contact information and an outline of what to expect next.  Reason for Visit:  enlarged lymph node  Proposed Diagnostic Care Plan: Labs collected today Mammogram has been ordered. [facility] will call you to schedule it.  [facility] [phone]  [address] [place], [place] [address]  What to Expect: - Generally, when lab tests are ordered the results can take up to 1 week for results to be available.  At that point, we will contact you to discuss your results with you.  Unless there is a critical result, we will typically wait for all of your lab results to be available before contacting you. - If a biopsy is part of your Care Plan, those results can take on average 7-10 days to result.  Once results are available, we will contact you to discuss your pathology results and any next steps. - If you have additional imaging ordered, such as a CT Scan, MRI, Ultrasound, Bone Scan, or PET scan, your imaging will need to be authorized then scheduled with the earliest available appointment.  You may be asked to travel to another hospital within [facility] who has a sooner availability, please consider doing so if asked. - If you use MyChart, your results will be available to you in the MyChart portal.  Your provider will be in touch with you as soon as all of your results are available to be discussed.  Your Diagnostic Clinic Provider:  Namon Cirri PA-C and Dr. Candise Che.  If you or your caregiver have number blocking on your cell phones, please ensure the cancer center's numbers are not blocked.  If you are not a registered MyChart user, please consider enrolling in MyChart to receive your test results and visit notes.  You can also access your discharge instructions  electronically.  MyChart also gives you an electronic means to communicate with your Care Team instead of needing to call in to the cancer center.  We appreciate you trusting [place] with your healthcare and look forward to partnering with you as we work to uncover what your potential diagnosis may be.  Please do not hesitate to reach out at any point with questions or concerns.

## 2022-11-18 NOTE — Progress Notes (Signed)
[facility] [phone]   [phone]  INITIAL CONSULTATION:  Patient Care Team: Patient, No Pcp Per as PCP - General (General Practice)  CHIEF COMPLAINTS/PURPOSE OF CONSULTATION:  "Axillary lymphadenopathy "  HISTORY OF PRESENTING ILLNESS:  Kristi Harmon 27 y.o. female with past medical history significant for anemia.  On review of the previous records patient is being referred from ED provider for left axillary lymphadenopathy. Patient presented to the ED on 11/08/22 for evaluation of lump in left axilla and exertional dyspnea. CTA performed was negative for PE, although did show multiple new enlarged left axillary and subpectoral lymph nodes, including a left axillary node measuring 1.7 cm with mild soft tissue swelling. Findings are new compared to CTA from 04/04/2020. Radiologist also commented on "no enlarged right axillary, mediastinal or hilar lymph nodes identified. Soft tissue thickening in the pre-vascular space is unchanged from the prior study, likely reflecting residual thymic tissue. The thyroid gland, trachea and esophagus demonstrate no significant findings. Subpleural triangular 4 mm right lower lobe nodule is unchanged, consistent with a benign lymph node."  On exam today patient is accompanied by significant other who provides additional history.  Patient reports since her ED visit the swelling and pain in her left underarm has significantly improved and the pain has resolved.  She tried multiple interventions including heat, ice, Tylenol and CBD oil. She does endorse pain in her left breast that she describes as a soreness.  Pain is intermittent and she rates it 3 out of 10 in severity.  Denies history of similar pain.  She does Jujitsu twice/week and denies any injury to her chest.  She also denies any recent vaccinations.  Patient denies any recent illness or URI symptoms preceding to the axillary swelling.  She does  admit to feeling decreased appetite although is under increased amount of stress and thinks it is from that. She is also endorsing a new rash on her face and neck that began after applying new retinol cream which she has since discontinued. Denies rash anywhere else on body. Patient reports having 3 cats in the home although denies any cat scratches.   Further discussion reveals patient smokes marijuana daily.  She quit smoking cigarettes this year after smoking 1 pack/month x 16 years.  She rarely drinks alcohol.  She has family history of her sister having skin cancer, paternal grandmother had colon cancer, maternal grand father and uncle both had lung cancer. Patient states her LMP was x 3 weeks ago. Last pap smear was 7 years ago and was normal.   MEDICAL HISTORY:  Past Medical History:  Diagnosis Date   Anemia    Pancreatitis     SURGICAL HISTORY: Past Surgical History:  Procedure Laterality Date   WISDOM TOOTH EXTRACTION      SOCIAL HISTORY: Social History   Socioeconomic History   Marital status: Single    Spouse name: Not on file   Number of children: Not on file   Years of education: Not on file   Highest education level: Not on file  Occupational History   Not on file  Tobacco Use   Smoking status: Former    Types: Cigarettes   Smokeless tobacco: Never  Vaping Use   Vaping status: Never Used  Substance and Sexual Activity   Alcohol use: Yes   Drug use: Yes    Types: Marijuana   Sexual activity: Yes  Other Topics Concern   Not on file  Social  History Narrative   Not on file   Social Determinants of Health   Financial Resource Strain: Not on file  Food Insecurity: Not on file  Transportation Needs: Not on file  Physical Activity: Not on file  Stress: Not on file  Social Connections: Not on file  Intimate Partner Violence: Not on file    FAMILY HISTORY: Family History  Problem Relation Age of Onset   Alcohol abuse Mother    Cancer Paternal  Grandmother     ALLERGIES:  is allergic to prozac [fluoxetine hcl].  MEDICATIONS:  Current Outpatient Medications  Medication Sig Dispense Refill   diphenhydramine-acetaminophen (TYLENOL PM) 25-500 MG TABS tablet Take 2 tablets by mouth at bedtime as needed (headache/pain/sleep).     polyvinyl alcohol (LIQUIFILM TEARS) 1.4 % ophthalmic solution Place 1 drop into both eyes as needed for dry eyes.     No current facility-administered medications for this visit.    REVIEW OF SYSTEMS:   All other systems are reviewed and are negative for acute change except as noted in the HPI.  PHYSICAL EXAMINATION: ECOG PERFORMANCE STATUS: 1 - Symptomatic but completely ambulatory  Vitals:   11/18/22 1105  BP: 109/80  Pulse: 96  Resp: 16  Temp: 98.5 F (36.9 C)  SpO2: 100%   Filed Weights   11/18/22 1105  Weight: 117 lb (53.1 kg)    Physical Exam Vitals reviewed.  Constitutional:      Appearance: She is not ill-appearing or toxic-appearing.  HENT:     Head: Normocephalic.     Right Ear: External ear normal.     Left Ear: External ear normal.     Nose: Nose normal.  Eyes:     General: No scleral icterus.    Conjunctiva/sclera: Conjunctivae normal.  Cardiovascular:     Rate and Rhythm: Normal rate and regular rhythm.     Pulses: Normal pulses.     Heart sounds: Normal heart sounds.  Pulmonary:     Effort: Pulmonary effort is normal.     Breath sounds: Normal breath sounds.  Chest:  Breasts:    Right: Normal.     Left: No inverted nipple or nipple discharge.       Comments: Exam chaperoned by patient's significant other.   Right breast without TTP, no masses. Abdominal:     General: There is no distension.  Musculoskeletal:        General: Normal range of motion.     Cervical back: Normal range of motion.  Lymphadenopathy:     Head:     Right side of head: No preauricular, posterior auricular or occipital adenopathy.     Left side of head: No preauricular, posterior  auricular or occipital adenopathy.     Cervical: No cervical adenopathy.     Upper Body:     Right upper body: No supraclavicular or axillary adenopathy.     Left upper body: Axillary adenopathy present. No supraclavicular adenopathy.     Lower Body: No right inguinal adenopathy. No left inguinal adenopathy.  Skin:    General: Skin is warm.     Findings: Rash (maculopapular on forehead, bilateral cheeks and neck) present.  Neurological:     Mental Status: She is alert.       LABORATORY DATA:  I have reviewed the data as listed    Latest Ref Rng & Units 11/18/2022   12:58 PM 11/13/2022    3:40 PM 09/11/2022    9:30 AM  CBC  WBC 4.0 -  10.5 K/uL 9.6  8.1  7.4   Hemoglobin 12.0 - 15.0 g/dL 16.1  09.6  04.5   Hematocrit 36.0 - 46.0 % 41.0  36.4  41.7   Platelets 150 - 400 K/uL 308  239  321        Latest Ref Rng & Units 11/18/2022   12:58 PM 11/13/2022    3:40 PM 09/11/2022    9:30 AM  CMP  Glucose 70 - 99 mg/dL 94  409  811   BUN 6 - 20 mg/dL 11  10  12    Creatinine 0.44 - 1.00 mg/dL 9.14  7.82  9.56   Sodium 135 - 145 mmol/L 137  139  139   Potassium 3.5 - 5.1 mmol/L 3.9  3.8  3.6   Chloride 98 - 111 mmol/L 103  104  106   CO2 22 - 32 mmol/L 25  26  22    Calcium 8.9 - 10.3 mg/dL 9.6  9.1  9.2   Total Protein 6.5 - 8.1 g/dL 8.1   7.9   Total Bilirubin 0.3 - 1.2 mg/dL 0.4   0.9   Alkaline Phos 38 - 126 U/L 52   41   AST 15 - 41 U/L 14   17   ALT 0 - 44 U/L 10   17      RADIOGRAPHIC STUDIES: I have personally reviewed the radiological images as listed and agreed with the findings in the report. CT Angio Chest PE W and/or Wo Contrast  Result Date: 11/13/2022 CLINICAL DATA:  Exertional dyspnea with left axillary swelling. Clinical concern for acute pulmonary embolism. EXAM: CT ANGIOGRAPHY CHEST WITH CONTRAST TECHNIQUE: Multidetector CT imaging of the chest was performed using the standard protocol during bolus administration of intravenous contrast. Multiplanar CT image  reconstructions and MIPs were obtained to evaluate the vascular anatomy. RADIATION DOSE REDUCTION: This exam was performed according to the departmental dose-optimization program which includes automated exposure control, adjustment of the mA and/or kV according to patient size and/or use of iterative reconstruction technique. CONTRAST:  75mL OMNIPAQUE IOHEXOL 350 MG/ML SOLN COMPARISON:  Chest CTA 04/04/2020 FINDINGS: Cardiovascular: The pulmonary arteries are well opacified with contrast to the level of the subsegmental branches. There is no evidence of acute pulmonary embolism. There is limited opacification of the systemic arteries. No acute systemic arterial abnormalities are identified. The heart size is normal. There is no pericardial effusion. Mediastinum/Nodes: There are multiple new enlarged left axillary and subpectoral lymph nodes, including a left axillary node measuring 1.7 cm short axis on image 38/4 and an additional node measuring 1.6 cm on image 48/4. Mild surrounding soft tissue swelling. No enlarged right axillary, mediastinal or hilar lymph nodes identified. Soft tissue thickening in the pre-vascular space is unchanged from the prior study, likely reflecting residual thymic tissue. The thyroid gland, trachea and esophagus demonstrate no significant findings. Lungs/Pleura: No pleural effusion or pneumothorax. No suspicious pulmonary findings. Subpleural triangular 4 mm right lower lobe nodule on image 123/12 is unchanged, consistent with a benign lymph node. Upper abdomen:  The visualized upper abdomen appears unremarkable. Musculoskeletal/Chest wall: There is no chest wall mass or suspicious osseous finding. No breast masses are identified. Unless specific follow-up recommendations are mentioned in the findings or impression sections, no imaging follow-up of any mentioned incidental findings is recommended. Review of the MIP images confirms the above findings. IMPRESSION: 1. No evidence of acute  pulmonary embolism or other acute chest findings. 2. New left axillary and subpectoral adenopathy with  mild surrounding soft tissue swelling. This is nonspecific, and could be infectious or neoplastic. Correlate clinically. Further evaluation recommended, including consideration of mammography and tissue sampling if not felt to be infectious/reactive. 3. Stable prevascular soft tissue, likely incidental thymic tissue based on stability. 4. No suspicious pulmonary findings. Electronically Signed   By: Carey Bullocks M.D.   On: 11/13/2022 17:01    ASSESSMENT & PLAN ACHOL WILCUTT is a 27 y.o. female presenting [facility] for consultation regarding left axillary lymphadenopathy. We have reviewed etiologies including infectious process, inflammatory process, malignancy.. Patient will proceed with laboratory workup today.   #Left axillary lymphadenopathy No recent vaccinations. Does has cat scratches and lives with poultry, cats and dogs. -Labs collected today include CBC, CMP, ESR, CRP, LDH, flow cytometry, Hep B & C serologies, HIV serology. Will also check Bartonella antibody panel as she has cats living in the home.  #Left breast pain -Tenderness to palpation of upper breat without palpable mass. -With axillary LAD further work up needing including mammogram and [place] of left axilla. Also will check pregnancy test today as there is not recent testing per chart review.  #Rash -Acute after applying new retinol cream. She has discontinued use.  -Exam without signs of underlying bacterial infection. Rash is only where cream was applied. Patient aware if rash starts to spread she will need to RTC for further evaluation.  -Patient will RTC when work up is complete.  Patient expressed understanding of the recommended workup and is agreeable to move forward.   All questions were answered. The patient knows to call the clinic with any problems, questions or concerns.  Shared visit with  Dr. Candise Che  Orders Placed This Encounter  Procedures   US BREAST COMPLETE UNI LEFT INC AXILLA    Standing Status:   Future    Standing Expiration Date:   11/18/2023    Order Specific Question:   Reason for Exam (SYMPTOM  OR DIAGNOSIS REQUIRED)    Answer:   lext axillary LAD    Order Specific Question:   Preferred imaging location?    [facility]   MM DIAG BREAST TOMO BILATERAL    Standing Status:   Future    Standing Expiration Date:   11/18/2023    Order Specific Question:   Reason for Exam (SYMPTOM  OR DIAGNOSIS REQUIRED)    Answer:   left breast tenderness and axillary LAD    Order Specific Question:   Is the patient pregnant?    Answer:   No    Order Specific Question:   Preferred imaging location?    Answer:   [facility]   [place] LT BREAST BX W LOC DEV 1ST LESION IMG BX SPEC US GUIDE    Standing Status:   Future    Standing Expiration Date:   11/18/2023    Order Specific Question:   Reason for Exam (SYMPTOM  OR DIAGNOSIS REQUIRED)    Answer:   L axillary LAD and breast tenderness    Order Specific Question:   Preferred location?    Answer:   [facility]   CBC with Differential (Cancer Center Only)    Standing Status:   Future    Number of Occurrences:   1    Standing Expiration Date:   11/18/2023   CMP (Cancer Center only)    Standing Status:   Future    Number of Occurrences:   1    Standing Expiration Date:  11/18/2023   Bartonella anitbody panel    Standing Status:   Future    Number of Occurrences:   1    Standing Expiration Date:   11/18/2023   Hepatitis B core antibody, total    Standing Status:   Future    Number of Occurrences:   1    Standing Expiration Date:   11/18/2023   Hepatitis B surface antigen    Standing Status:   Future    Number of Occurrences:   1    Standing Expiration Date:   11/18/2023   Hepatitis B surface antibody    Standing Status:   Future    Number of Occurrences:   1    Standing Expiration Date:   11/18/2023   Hepatitis C  antibody    Standing Status:   Future    Number of Occurrences:   1    Standing Expiration Date:   11/18/2023   HIV antibody (with reflex)    Standing Status:   Future    Number of Occurrences:   1    Standing Expiration Date:   11/18/2023   hCG, quantitative, pregnancy   Flow Cytometry, Peripheral Blood (Oncology)    Standing Status:   Future    Number of Occurrences:   1    Standing Expiration Date:   11/18/2023   Lactate dehydrogenase (LDH)    Standing Status:   Future    Number of Occurrences:   1    Standing Expiration Date:   11/18/2023    I have spent a total of 60 minutes minutes of face-to-face and non-face-to-face time, preparing to see the patient, obtaining and/or reviewing separately obtained history, performing a medically appropriate examination, counseling and educating the patient, ordering medications/tests/procedures, referring and communicating with other health care professionals, documenting clinical information in the electronic health record, independently interpreting results and communicating results to the patient, and care coordination.   Namon Cirri PA-C Department of Hematology/Oncology [facility] Phone: [phone]   ADDENDUM  .Patient was Personally and independently interviewed, examined and relevant elements of the history of present illness were reviewed in details and an assessment and plan was created. All elements of the patient's history of present illness , assessment and plan were discussed in details with Namon Cirri PA-C. The above documentation reflects our combined findings assessment and plan.   Wyvonnia Lora MD MS

## 2022-11-19 ENCOUNTER — Ambulatory Visit
Admission: RE | Admit: 2022-11-19 | Discharge: 2022-11-19 | Disposition: A | Discharge status: Home / Self Care | Payer: No Typology Code available for payment source | Source: Ambulatory Visit | Attending: Physician Assistant

## 2022-11-19 ENCOUNTER — Ambulatory Visit
Admission: RE | Admit: 2022-11-19 | Discharge: 2022-11-19 | Disposition: A | Discharge status: Home / Self Care | Payer: No Typology Code available for payment source | Source: Ambulatory Visit | Attending: Physician Assistant | Admitting: Physician Assistant

## 2022-11-19 ENCOUNTER — Other Ambulatory Visit: Payer: Self-pay | Admitting: Physician Assistant

## 2022-11-19 DIAGNOSIS — R21 Rash and other nonspecific skin eruption: Secondary | ICD-10-CM

## 2022-11-19 DIAGNOSIS — N644 Mastodynia: Secondary | ICD-10-CM

## 2022-11-19 DIAGNOSIS — R591 Generalized enlarged lymph nodes: Secondary | ICD-10-CM

## 2022-11-20 ENCOUNTER — Telehealth: Payer: Self-pay

## 2022-11-20 LAB — BARTONELLA ANTIBODY PANEL
B Quintana IgM: NEGATIVE {titer}
B henselae IgG: 1:640 {titer} — ABNORMAL HIGH
B henselae IgM: NEGATIVE {titer}
B quintana IgG: NEGATIVE {titer}

## 2022-11-20 LAB — FLOW CYTOMETRY

## 2022-11-20 LAB — SURGICAL PATHOLOGY

## 2022-11-20 NOTE — Telephone Encounter (Signed)
RN called and spoke with patient per request of Domingo Sep., PA-C regarding recent test results. Patient verbalized understanding that so far, the tests are WNL and she can expect a phone call from [place], [place] tomorrow late morning to go over the results and plan in detail. Patient verbalized thanks and understanding.

## 2022-11-21 ENCOUNTER — Telehealth: Payer: Self-pay | Admitting: Physician Assistant

## 2022-11-21 MED ORDER — AZITHROMYCIN 250 MG PO TABS: ORAL_TABLET | ORAL | 0 refills | Status: AC

## 2022-11-21 NOTE — Telephone Encounter (Signed)
I notified Kristi Harmon by phone regarding diagnostic work up results. 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 Prescription sent to pharmacy for azithromycin. Also discussed with patient mammogram report stating she has dense breast tissue and she should continue self breast exams and seek evaluation if abnormal findings arise. Lab work is also showing she does not have immunity to hepatitis B. Patient plans to establish care with a PCP and can receive the series there or at a local health department. All of patient's questions were answered and she expressed understanding of the plan provided.

## 2022-12-23 ENCOUNTER — Telehealth: Payer: Self-pay | Admitting: Family Medicine

## 2022-12-23 DIAGNOSIS — J029 Acute pharyngitis, unspecified: Secondary | ICD-10-CM

## 2022-12-23 MED ORDER — AMOXICILLIN 875 MG PO TABS: 875.0000 mg | ORAL_TABLET | Freq: Two times a day (BID) | ORAL | 0 refills | Status: AC

## 2022-12-23 NOTE — Progress Notes (Signed)
Virtual Visit Consent   Kristi Harmon, you are scheduled for a virtual visit with a [facility] provider today. Just as with appointments in the office, your consent must be obtained to participate. Your consent will be active for this visit and any virtual visit you may have with one of our providers in the next 365 days. If you have a MyChart account, a copy of this consent can be sent to you electronically.  As this is a virtual visit, video technology does not allow for your provider to perform a traditional examination. This may limit your provider's ability to fully assess your condition. If your provider identifies any concerns that need to be evaluated in person or the need to arrange testing (such as labs, EKG, etc.), we will make arrangements to do so. Although advances in technology are sophisticated, we cannot ensure that it will always work on either your end or our end. If the connection with a video visit is poor, the visit may have to be switched to a telephone visit. With either a video or telephone visit, we are not always able to ensure that we have a secure connection.  By engaging in this virtual visit, you consent to the provision of healthcare and authorize for your insurance to be billed (if applicable) for the services provided during this visit. Depending on your insurance coverage, you may receive a charge related to this service.  I need to obtain your verbal consent now. Are you willing to proceed with your visit today? [facility] has provided verbal consent on 12/23/2022 for a virtual visit (video or telephone). Georgana Curio, FNP  Date: 12/23/2022 4:32 PM  Virtual Visit via Video Note   I, Georgana Curio, connected with  Kristi Harmon  ([id number], August 17, 1995) on 12/23/22 at  4:30 PM EDT by a video-enabled telemedicine application and verified that I am speaking with the correct person using two identifiers.  Location: Patient: Virtual Visit Location Patient:  Home Provider: Virtual Visit Location Provider: Home Office   I discussed the limitations of evaluation and management by telemedicine and the availability of in person appointments. The patient expressed understanding and agreed to proceed.    History of Present Illness: Kristi Harmon is a 27 y.o. who identifies as a female who was assigned female at birth, and is being seen today for sore throat, bad headache, rt ear pain, no fever or cough. Sx for 5 days worsening. Marland Kitchen  HPI: HPI  Problems: There are no problems to display for this patient.   Allergies:  Allergies  Allergen Reactions   Prozac [Fluoxetine Hcl] Nausea And Vomiting   Medications:  Current Outpatient Medications:    diphenhydramine-acetaminophen (TYLENOL PM) 25-500 MG TABS tablet, Take 2 tablets by mouth at bedtime as needed (headache/pain/sleep)., Disp: , Rfl:    polyvinyl alcohol (LIQUIFILM TEARS) 1.4 % ophthalmic solution, Place 1 drop into both eyes as needed for dry eyes., Disp: , Rfl:   Observations/Objective: Patient is well-developed, well-nourished in no acute distress.  Resting comfortably  at home.  Head is normocephalic, atraumatic.  No labored breathing.  Speech is clear and coherent with logical content.  Patient is alert and oriented at baseline.    Assessment and Plan: 1. Pharyngitis, unspecified etiology  Increase fluids, warm salt water gargles, ibuprofen as directed, UC if sx persist or worsen.   Follow Up Instructions: I discussed the assessment and treatment plan with the patient. The patient was provided an opportunity to ask  questions and all were answered. The patient agreed with the plan and demonstrated an understanding of the instructions.  A copy of instructions were sent to the patient via MyChart unless otherwise noted below.     The patient was advised to call back or seek an in-person evaluation if the symptoms worsen or if the condition fails to improve as anticipated.    Georgana Curio, FNP

## 2022-12-23 NOTE — Patient Instructions (Signed)

## 2023-01-01 ENCOUNTER — Telehealth: Payer: Self-pay

## 2023-01-01 ENCOUNTER — Telehealth: Payer: Self-pay | Admitting: Physician Assistant

## 2023-01-01 DIAGNOSIS — T3695XA Adverse effect of unspecified systemic antibiotic, initial encounter: Secondary | ICD-10-CM

## 2023-01-01 DIAGNOSIS — B379 Candidiasis, unspecified: Secondary | ICD-10-CM

## 2023-01-01 MED ORDER — FLUCONAZOLE 150 MG PO TABS
ORAL_TABLET | ORAL | 0 refills | Status: AC
Start: 2023-01-01 — End: ?

## 2023-01-01 NOTE — Progress Notes (Signed)
Virtual Visit Consent   Kristi Harmon, you are scheduled for a virtual visit with a [facility] provider today. Just as with appointments in the office, your consent must be obtained to participate. Your consent will be active for this visit and any virtual visit you may have with one of our providers in the next 365 days. If you have a MyChart account, a copy of this consent can be sent to you electronically.  As this is a virtual visit, video technology does not allow for your provider to perform a traditional examination. This may limit your provider's ability to fully assess your condition. If your provider identifies any concerns that need to be evaluated in person or the need to arrange testing (such as labs, EKG, etc.), we will make arrangements to do so. Although advances in technology are sophisticated, we cannot ensure that it will always work on either your end or our end. If the connection with a video visit is poor, the visit may have to be switched to a telephone visit. With either a video or telephone visit, we are not always able to ensure that we have a secure connection.  By engaging in this virtual visit, you consent to the provision of healthcare and authorize for your insurance to be billed (if applicable) for the services provided during this visit. Depending on your insurance coverage, you may receive a charge related to this service.  I need to obtain your verbal consent now. Are you willing to proceed with your visit today? Kristi Harmon has provided verbal consent on 01/01/2023 for a virtual visit (video or telephone). Piedad Climes, [place]  Date: 01/01/2023 12:20 PM  Virtual Visit via Video Note   I, Piedad Climes, connected with  Kristi Harmon  ([id number], July 11, 1995) on 01/01/23 at 12:30 PM EDT by a video-enabled telemedicine application and verified that I am speaking with the correct person using two identifiers.  Location: Patient: Virtual Visit Location  Patient: Home Provider: Virtual Visit Location Provider: Home Office   I discussed the limitations of evaluation and management by telemedicine and the availability of in person appointments. The patient expressed understanding and agreed to proceed.    History of Present Illness: Kristi Harmon is a 27 y.o. who identifies as a female who was assigned female at birth, and is being seen today for possible antibiotic -induced yeast infection. First noted symptoms on Monday with vaginal itching, thick and white discharge. Occasionally some blood when wiping due to irritation.  Is finishing course of Amoxicillin for bacterial pharyngitis. LMP  ended 10/12.   HPI: HPI  Problems: There are no problems to display for this patient.   Allergies:  Allergies  Allergen Reactions   Prozac [Fluoxetine Hcl] Nausea And Vomiting   Medications:  Current Outpatient Medications:    fluconazole (DIFLUCAN) 150 MG tablet, Take 1 tablet PO once. Repeat in 3 days if needed., Disp: 2 tablet, Rfl: 0   amoxicillin (AMOXIL) 875 MG tablet, Take 1 tablet (875 mg total) by mouth 2 (two) times daily for 10 days., Disp: 20 tablet, Rfl: 0   diphenhydramine-acetaminophen (TYLENOL PM) 25-500 MG TABS tablet, Take 2 tablets by mouth at bedtime as needed (headache/pain/sleep)., Disp: , Rfl:    polyvinyl alcohol (LIQUIFILM TEARS) 1.4 % ophthalmic solution, Place 1 drop into both eyes as needed for dry eyes., Disp: , Rfl:   Observations/Objective: Patient is well-developed, well-nourished in no acute distress.  Resting comfortably at home.  Head is  normocephalic, atraumatic.  No labored breathing. Speech is clear and coherent with logical content.  Patient is alert and oriented at baseline.   Assessment and Plan: 1. Antibiotic-induced yeast infection - fluconazole (DIFLUCAN) 150 MG tablet; Take 1 tablet PO once. Repeat in 3 days if needed.  Dispense: 2 tablet; Refill: 0  Supportive measures reviewed. Will start two-dose  course of Diflucan. In person precautions reviewed.   Follow Up Instructions: I discussed the assessment and treatment plan with the patient. The patient was provided an opportunity to ask questions and all were answered. The patient agreed with the plan and demonstrated an understanding of the instructions.  A copy of instructions were sent to the patient via MyChart unless otherwise noted below.    The patient was advised to call back or seek an in-person evaluation if the symptoms worsen or if the condition fails to improve as anticipated.    Piedad Climes, PA-C

## 2023-01-01 NOTE — Patient Instructions (Signed)
[place], thank you for joining Piedad Climes, PA-C for today's virtual visit.  While this provider is not your primary care provider (PCP), if your PCP is located in our provider database this encounter information will be shared with them immediately following your visit.   A [facility] MyChart account gives you access to today's visit and all your visits, tests, and labs performed at [facility] " click here if you don't have a [facility] MyChart account or go to [email or web address]  Consent: (Patient) Kristi Harmon provided verbal consent for this virtual visit at the beginning of the encounter.  Current Medications:  Current Outpatient Medications:    amoxicillin (AMOXIL) 875 MG tablet, Take 1 tablet (875 mg total) by mouth 2 (two) times daily for 10 days., Disp: 20 tablet, Rfl: 0   diphenhydramine-acetaminophen (TYLENOL PM) 25-500 MG TABS tablet, Take 2 tablets by mouth at bedtime as needed (headache/pain/sleep)., Disp: , Rfl:    polyvinyl alcohol (LIQUIFILM TEARS) 1.4 % ophthalmic solution, Place 1 drop into both eyes as needed for dry eyes., Disp: , Rfl:    Medications ordered in this encounter:  No orders of the defined types were placed in this encounter.    *If you need refills on other medications prior to your next appointment, please contact your pharmacy*  Follow-Up: Call back or seek an in-person evaluation if the symptoms worsen or if the condition fails to improve as anticipated.  [facility] [phone]  Other Instructions Vaginal Yeast Infection, Adult  Vaginal yeast infection is a condition that causes vaginal discharge as well as soreness, swelling, and redness (inflammation) of the vagina. This is a common condition. Some women get this infection frequently. What are the causes? This condition is caused by a change in the normal balance of the yeast (Candida) and normal bacteria that live in the vagina. This change  causes an overgrowth of yeast, which causes the inflammation. What increases the risk? The condition is more likely to develop in women who: Take antibiotic medicines. Have diabetes. Take birth control pills. Are pregnant. Douche often. Have a weak body defense system (immune system). Have been taking steroid medicines for a long time. Frequently wear tight clothing. What are the signs or symptoms? Symptoms of this condition include: White, thick, creamy vaginal discharge. Swelling, itching, redness, and irritation of the vagina. The lips of the vagina (labia) may be affected as well. Pain or a burning feeling while urinating. Pain during sex. How is this diagnosed? This condition is diagnosed based on: Your medical history. A physical exam. A pelvic exam. Your health care provider will examine a sample of your vaginal discharge under a microscope. Your health care provider may send this sample for testing to confirm the diagnosis. How is this treated? This condition is treated with medicine. Medicines may be over-the-counter or prescription. You may be told to use one or more of the following: Medicine that is taken by mouth (orally). Medicine that is applied as a cream (topically). Medicine that is inserted directly into the vagina (suppository). Follow these instructions at home: Take or apply over-the-counter and prescription medicines only as told by your health care provider. Do not use tampons until your health care provider approves. Do not have sex until your infection has cleared. Sex can prolong or worsen your symptoms of infection. Ask your health care provider when it is safe to resume sexual activity. Keep all follow-up visits. This is important. How is this prevented?  Do not wear tight clothes, such as pantyhose or tight pants. Wear breathable cotton underwear. Do not use douches, perfumed soap, creams, or powders. Wipe from front to back after using the  toilet. If you have diabetes, keep your blood sugar levels under control. Ask your health care provider for other ways to prevent yeast infections. Contact a health care provider if: You have a fever. Your symptoms go away and then return. Your symptoms do not get better with treatment. Your symptoms get worse. You have new symptoms. You develop blisters in or around your vagina. You have blood coming from your vagina and it is not your menstrual period. You develop pain in your abdomen. Summary Vaginal yeast infection is a condition that causes discharge as well as soreness, swelling, and redness (inflammation) of the vagina. This condition is treated with medicine. Medicines may be over-the-counter or prescription. Take or apply over-the-counter and prescription medicines only as told by your health care provider. Do not douche. Resume sexual activity or use of tampons as instructed by your health care provider. Contact a health care provider if your symptoms do not get better with treatment or your symptoms go away and then return. This information is not intended to replace advice given to you by your health care provider. Make sure you discuss any questions you have with your health care provider. Document Revised: 05/21/2020 Document Reviewed: 05/21/2020 Elsevier Patient Education  2024 Elsevier Inc.    If you have been instructed to have an in-person evaluation today at a local Urgent Care facility, please use the link below. It will take you to a list of all of our available [facility], including address, phone number and hours of operation. Please do not delay care.  [facility]  If you or a family member do not have a primary care provider, use the link below to schedule a visit and establish care. When you choose a [facility] primary care physician or advanced practice provider, you gain a long-term partner in health. Find a Primary Care  Provider  Learn more about [facility] in-office and virtual care options: [facility] - Get Care Now

## 2023-01-22 ENCOUNTER — Telehealth: Payer: Self-pay | Admitting: Physician Assistant

## 2023-01-22 DIAGNOSIS — N926 Irregular menstruation, unspecified: Secondary | ICD-10-CM

## 2023-01-22 DIAGNOSIS — R102 Pelvic and perineal pain: Secondary | ICD-10-CM

## 2023-01-22 DIAGNOSIS — R509 Fever, unspecified: Secondary | ICD-10-CM

## 2023-01-22 NOTE — Progress Notes (Signed)
Because of substantial pelvic pain and cramping with increased bleeding, but more concerning, fever, I feel your condition warrants further evaluation and I recommend that you be seen in a face to face visit. IF you have a GYN I would recommend contacting them first thing today for assessment. If not, please be seen at one of the facilities below. If anything is acutely worsening, please seek an ER evaluation.   NOTE: There will be NO CHARGE for this eVisit   If you are having a true medical emergency please call 911.      For an urgent face to face visit, [facility] has eight urgent care centers for your convenience:   NEW!! [facility] Center at [facility] Get Driving Directions [phone] [address] [place], [address]    [facility]gent Care Center at [facility] Get Driving Directions [phone] [address] [place], [place] [address]   [facility] Health Urgent Care Center [facility]) Get Driving Directions [phone] [address] [place], [place] [address]  [facility] Urgent Care Center [facility]) Get Driving Directions [phone] [address] [place],  [place]  [address]  [facility] Urgent Care Center [facility] - at [employer]  [phone] [address] [place],  [place] [address]   [facility] Urgent Care at [facility] Get Driving Directions [phone] [address] [place], [place] [address]   [facility] Urgent Care at [facility] Get Driving Directions  [phone] [address] [place], [place] [address]   [facility] Urgent Care at [facility] Directions [phone] [address] [place], [place] [address]  Your MyChart E-visit questionnaire answers were reviewed by a board certified advanced clinical practitioner to complete your personal care plan based  on your specific symptoms.  Thank you for using e-Visits.

## 2023-10-16 ENCOUNTER — Emergency Department (HOSPITAL_COMMUNITY)
Admission: EM | Admit: 2023-10-16 | Discharge: 2023-10-16 | Disposition: A | Discharge status: Home / Self Care | Payer: Self-pay | Attending: Emergency Medicine | Admitting: Emergency Medicine

## 2023-10-16 ENCOUNTER — Telehealth: Payer: Self-pay

## 2023-10-16 ENCOUNTER — Other Ambulatory Visit: Payer: Self-pay

## 2023-10-16 ENCOUNTER — Emergency Department (HOSPITAL_COMMUNITY): Payer: Self-pay

## 2023-10-16 DIAGNOSIS — N6323 Unspecified lump in the left breast, lower outer quadrant: Secondary | ICD-10-CM | POA: Insufficient documentation

## 2023-10-16 NOTE — Telephone Encounter (Signed)
 Patient called and informed scheduler that she has a left breast lump. Patient did not meet BCCCP income guidelines. Patient stated she felt the lump in the left breast just above the nipple x 2 days ago after noticing left breast tenderness. Patient stated she does have soreness prior to menses, but this feels different. Patient informed to go to Urgent Care as she does not have a pcp, contact office if needed.

## 2023-10-16 NOTE — ED Triage Notes (Signed)
 Pt here from [facility], [facility], as she doesn't have a PCP. Pt states she noticed a hard lump in the left breast last night, denies pain.

## 2023-10-16 NOTE — Discharge Instructions (Signed)
 Referral given for [facility], please contact them to follow up.

## 2023-10-16 NOTE — ED Provider Notes (Signed)
 [facility] EMERGENCY DEPARTMENT AT [facility] Provider Note   CSN: [id number] Arrival date & time: 10/16/23  1139     Patient presents with: Breast Mass   Kristi Harmon is a 28 y.o. female who presents to the ED today with concerns for a new found lump in the her left breast.  She was doing a self breast exam at home yesterday and felt lump in the lateral left breast.  Denies having any pain or tenderness, denies having any discharge from the nipple, no change in skin appearance noted by the patient.  She had a previous biopsy of the right breast approximately 1 year prior and was found to be secondary to lymphadenopathy secondary to a cutaneous infection.  Previously seen at urgent care, referred to the ED as when she contacted the breast cancer center, they stated they do not accept referrals from urgent cares.  Seeking referral to the breast cancer center for further evaluation and treatment.  Does not have PCP secondary to lack of insurance at this time.   HPI     Prior to Admission medications   Medication Sig Start Date End Date Taking? Authorizing Provider  diphenhydramine -acetaminophen  (TYLENOL  PM) 25-500 MG TABS tablet Take 2 tablets by mouth at bedtime as needed (headache/pain/sleep).    [provider]  fluconazole  (DIFLUCAN ) 150 MG tablet Take 1 tablet PO once. Repeat in 3 days if needed. 01/01/23   Gladis Elsie BROCKS, PA-C  polyvinyl alcohol (LIQUIFILM TEARS) 1.4 % ophthalmic solution Place 1 drop into both eyes as needed for dry eyes.    [provider]    Allergies: Prozac [fluoxetine hcl]    Review of Systems  Constitutional:  Negative for chills and fever.  HENT:  Negative for sore throat.   Eyes:  Negative for visual disturbance.  Respiratory:  Negative for cough and shortness of breath.   Cardiovascular:  Negative for chest pain.  Gastrointestinal:  Negative for abdominal pain, diarrhea, nausea and vomiting.  Genitourinary:  Negative  for dysuria and frequency.  Musculoskeletal:  Negative for back pain and neck pain.  Skin:  Negative for rash.  Neurological:  Negative for weakness, numbness and headaches.  Hematological:  Negative for adenopathy.  Psychiatric/Behavioral:  Negative for behavioral problems.     Updated Vital Signs BP 132/83 (BP Location: Right Arm)   Pulse 95   Temp 98.1 F (36.7 C) (Oral)   Resp 18   Ht 5' 3 (1.6 m)   Wt 52.2 kg   SpO2 100%   BMI 20.37 kg/m   Physical Exam Vitals and nursing note reviewed. Exam conducted with a chaperone present.  Constitutional:      General: She is not in acute distress.    Appearance: Normal appearance.  HENT:     Head: Normocephalic and atraumatic.     Mouth/Throat:     Mouth: Mucous membranes are moist.     Pharynx: Oropharynx is clear.  Eyes:     Extraocular Movements: Extraocular movements intact.     Conjunctiva/sclera: Conjunctivae normal.     Pupils: Pupils are equal, round, and reactive to light.  Cardiovascular:     Rate and Rhythm: Normal rate and regular rhythm.     Pulses: Normal pulses.     Heart sounds: Normal heart sounds. No murmur heard.    No friction rub. No gallop.  Pulmonary:     Effort: Pulmonary effort is normal.     Breath sounds: Normal breath sounds.  Chest:  Chest wall: No tenderness.  Breasts:    Right: Normal.     Left: Mass present.     Comments: Small dense localized mass to the left lateral breast appreciated.  No axillary lymphadenopathy appreciated and there is no tenderness to palpation.  Overlying skin is unchanged and normal-appearing. Abdominal:     General: Abdomen is flat. Bowel sounds are normal.     Palpations: Abdomen is soft.  Musculoskeletal:        General: Normal range of motion.     Cervical back: Normal range of motion and neck supple.  Skin:    General: Skin is warm and dry.     Capillary Refill: Capillary refill takes less than 2 seconds.  Neurological:     General: No focal deficit  present.     Mental Status: She is alert. Mental status is at baseline.  Psychiatric:        Mood and Affect: Mood normal.     (all labs ordered are listed, but only abnormal results are displayed) Labs Reviewed - No data to display  EKG: None  Radiology: US  LIMITED ULTRASOUND INCLUDING AXILLA LEFT BREAST  Result Date: 10/16/2023 CLINICAL DATA:  Lump in the left breast suggesting abscess EXAM: ULTRASOUND OF THE left BREAST COMPARISON:  Left axilla ultrasound 11/19/2022 FINDINGS: Targeted ultrasound is performed, showing no focal abnormality in the area of palpable concern. No abscess is identified. IMPRESSION: No abscess identified in the left breast to correspond to area of palpable concern. RECOMMENDATION: This examination was performed on an emergent/urgent basis to answer a single clinical question and does not constitute a full diagnostic work-up. A full diagnostic work-up at a facility that provides diagnostic breast imaging is recommended to include diagnostic mammography and/or repeat breast ultrasound as soon as possible. Electronically Signed   By: Elsie Gravely M.D.   On: 10/16/2023 17:05     Procedures   Medications Ordered in the ED - No data to display                                  Medical Decision Making Amount and/or Complexity of Data Reviewed Radiology: ordered.   Medical Decision Making:   Kristi Harmon is a 28 y.o. female who presented to the ED today with concerns for mass in the left breast detailed above.     Complete initial physical exam performed, notably the patient  was alert and oriented in no apparent distress.  Remarkable exam finding of a dense localized mass identified in the left lateral breast..    Reviewed and confirmed nursing documentation for past medical history, family history, social history.    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  Further consider possibility of malignancy of the breast, also lymphadenopathy.   Initial Plan:  Obtain limited ultrasound of the left breast including left axilla. Based on remainder of physical exam and history of present illness, defer further lab and imaging workup at this time. Objective evaluation as below reviewed   Initial Study Results:     Radiology:  All images reviewed independently. Agree with radiology report at this time.   US  LIMITED ULTRASOUND INCLUDING AXILLA LEFT BREAST  Result Date: 10/16/2023 CLINICAL DATA:  Lump in the left breast suggesting abscess EXAM: ULTRASOUND OF THE left BREAST COMPARISON:  Left axilla ultrasound 11/19/2022 FINDINGS: Targeted  ultrasound is performed, showing no focal abnormality in the area of palpable concern. No abscess is identified. IMPRESSION: No abscess identified in the left breast to correspond to area of palpable concern. RECOMMENDATION: This examination was performed on an emergent/urgent basis to answer a single clinical question and does not constitute a full diagnostic work-up. A full diagnostic work-up at a facility that provides diagnostic breast imaging is recommended to include diagnostic mammography and/or repeat breast ultrasound as soon as possible. Electronically Signed   By: Elsie Gravely M.D.   On: 10/16/2023 17:05       Reassessment and Plan:   After evaluation of the patient and review of ultrasound obtained, does not appear to have a appearance of a abscess in the left breast, and referral was been made to the breast imaging center for continued evaluation of this patient.  This was explained in detail to the patient, they understand agree Of further concerns at this time.       Final diagnoses:  Mass of lower outer quadrant of left breast    ED Discharge Orders     None          Kristi Harmon, [place] 10/16/23 1713    Trine Raynell Moder, MD 10/19/23 [phone]

## 2023-10-16 NOTE — Telephone Encounter (Signed)
 Patient telephoned and completed BCCCP screening process. Patient ineligible based on federal poverty guidelines. Advised patient to contact [facility] for payment options.

## 2023-10-19 ENCOUNTER — Encounter: Payer: Self-pay | Admitting: Physician Assistant

## 2023-10-20 ENCOUNTER — Other Ambulatory Visit: Payer: Self-pay | Admitting: *Deleted

## 2023-10-20 DIAGNOSIS — N6320 Unspecified lump in the left breast, unspecified quadrant: Secondary | ICD-10-CM

## 2023-10-28 ENCOUNTER — Ambulatory Visit
Admission: RE | Admit: 2023-10-28 | Discharge: 2023-10-28 | Disposition: A | Discharge status: Home / Self Care | Payer: Self-pay | Source: Ambulatory Visit | Attending: Physician Assistant | Admitting: Physician Assistant

## 2023-10-28 DIAGNOSIS — N6320 Unspecified lump in the left breast, unspecified quadrant: Secondary | ICD-10-CM

## 2024-02-05 ENCOUNTER — Encounter: Payer: Self-pay | Admitting: Oncology
